# Patient Record
Sex: Female | Born: 1960 | Race: White | Hispanic: No | Marital: Married | Smoking: Never smoker
Health system: Southern US, Community
[De-identification: ages and names within clinical notes are randomized; demographics above are authoritative.]

## PROBLEM LIST (undated history)

## (undated) DIAGNOSIS — D509 Iron deficiency anemia, unspecified: Secondary | ICD-10-CM

## (undated) DIAGNOSIS — J309 Allergic rhinitis, unspecified: Secondary | ICD-10-CM

## (undated) DIAGNOSIS — I10 Essential (primary) hypertension: Secondary | ICD-10-CM

## (undated) DIAGNOSIS — M1990 Unspecified osteoarthritis, unspecified site: Secondary | ICD-10-CM

## (undated) DIAGNOSIS — E785 Hyperlipidemia, unspecified: Secondary | ICD-10-CM

## (undated) DIAGNOSIS — J45909 Unspecified asthma, uncomplicated: Secondary | ICD-10-CM

## (undated) HISTORY — PX: COLONOSCOPY: SHX174

## (undated) HISTORY — DX: Iron deficiency anemia, unspecified: D50.9

## (undated) HISTORY — DX: Unspecified osteoarthritis, unspecified site: M19.90

## (undated) HISTORY — DX: Essential (primary) hypertension: I10

## (undated) HISTORY — DX: Unspecified asthma, uncomplicated: J45.909

## (undated) HISTORY — PX: ESOPHAGOGASTRODUODENOSCOPY: SHX1529

## (undated) HISTORY — PX: KNEE SURGERY: SHX244

## (undated) HISTORY — DX: Hyperlipidemia, unspecified: E78.5

---

## 2006-04-11 ENCOUNTER — Ambulatory Visit: Payer: Self-pay | Admitting: Gynecology

## 2007-06-12 ENCOUNTER — Ambulatory Visit: Payer: Self-pay | Admitting: Gastroenterology

## 2007-06-26 ENCOUNTER — Ambulatory Visit: Payer: Self-pay | Admitting: Gastroenterology

## 2010-09-21 ENCOUNTER — Ambulatory Visit: Payer: Self-pay | Admitting: Gastroenterology

## 2010-09-22 LAB — PATHOLOGY REPORT

## 2012-06-02 LAB — PULMONARY FUNCTION TEST

## 2012-07-11 ENCOUNTER — Encounter: Payer: Self-pay | Admitting: Pulmonary Disease

## 2012-07-11 ENCOUNTER — Ambulatory Visit (INDEPENDENT_AMBULATORY_CARE_PROVIDER_SITE_OTHER): Payer: Self-pay | Admitting: Pulmonary Disease

## 2012-07-11 VITALS — BP 142/94 | HR 88 | Temp 98.1°F | Ht 61.0 in | Wt 126.0 lb

## 2012-07-11 DIAGNOSIS — J309 Allergic rhinitis, unspecified: Secondary | ICD-10-CM | POA: Insufficient documentation

## 2012-07-11 DIAGNOSIS — J45909 Unspecified asthma, uncomplicated: Secondary | ICD-10-CM

## 2012-07-11 DIAGNOSIS — J4520 Mild intermittent asthma, uncomplicated: Secondary | ICD-10-CM

## 2012-07-11 MED ORDER — BECLOMETHASONE DIPROPIONATE 80 MCG/ACT IN AERS: 1.0000 | INHALATION_SPRAY | Freq: Two times a day (BID) | RESPIRATORY_TRACT | Status: DC

## 2012-07-11 NOTE — Progress Notes (Signed)
Subjective:    Patient ID: Barbara Acosta, female    DOB: May 17, 1960, 52 y.o.   MRN: [id number]  HPI  This is a very pleasant 52 year old female with a past medical history of asthma who comes to our clinic today for evaluation and management of the same. She believes that she had asthma as a child because she had recurrent bronchitis however she never received a diagnosis. She never smoked cigarettes, however in her early 52s she developed the onset of recurrent bronchitis, chest heaviness, shortness of breath, and cough which would not go away. Apparently she had a chest x-ray which was worrisome for asthma and so she saw a pulmonologist here in town and underwent pulmonary function testing. She was given a diagnosis of asthma and was started on inhaled corticosteroids at that time. Since that point she has had yearly flares of asthma requiring treatment with a Z-Pak and occasionally steroids. Typically these happen in the wintertime and resolve quite quickly with therapy. However in 2014 she has had ongoing symptoms of shortness of breath and cough.  Starting December 2013 she had the sudden onset of wheezing, and coughing. This was associated with myalgias and fatigue. She had been on Advair 100/50 up until this point and had been using the medication regularly. However, her cough and shortness of breath persisted longer than normal. She was given prednisone and antibiotics for nearly the entire month of January and several weeks in February as well. She was treated with both azithromycin and Levaquin. Apparently her current cough lasted for quite some time and was very difficult to treat. She was also seen by her ear nose and throat physician who changed her Advair to [facility].  She did not feel that this change made much of a difference. On 05/09/2012 she stopped taking all of her medications with the exception of Allegra. She stated that slowly, over the next several weeks she had improvement in cough,  shortness of breath, and a scratchy throat. Currently, she does not have symptoms of cough or shortness of breath that she feels that she is still recovering somewhat from the illness. Specifically, she states that she had leg weakness which developed after several weeks of prednisone has been slow to recover. She currently is capable of working out from to an hour several days a week at the gym and she feels that things are starting to get better. She has not had to use albuterol any more often than twice a month in the last 2 months.     Past Medical History  Diagnosis Date  . Hypertension   . Asthma   . Osteoarthritis   . Iron deficiency anemia   . Hyperlipidemia      Family History  Problem Relation Age of Onset  . Cancer - Colon Mother   . Uterine cancer Mother   . Lung cancer Brother      History   Social History  . Marital Status: Married    Spouse Name: N/A    Number of Children: N/A  . Years of Education: N/A   Occupational History  . Not on file.   Social History Main Topics  . Smoking status: Never Smoker   . Smokeless tobacco: Never Used  . Alcohol Use: No  . Drug Use: No  . Sexually Active: Not on file   Other Topics Concern  . Not on file   Social History Narrative  . No narrative on file     No Known Allergies  No outpatient prescriptions prior to visit.   No facility-administered medications prior to visit.      Review of Systems  Constitutional: Positive for diaphoresis and activity change. Negative for fever, chills, appetite change, fatigue and unexpected weight change.  HENT: Positive for nosebleeds, rhinorrhea and voice change. Negative for hearing loss, ear pain, congestion, sore throat, facial swelling, sneezing, mouth sores, trouble swallowing, neck pain, neck stiffness, dental problem, postnasal drip, sinus pressure, tinnitus and ear discharge.   Eyes: Positive for itching. Negative for photophobia, discharge and visual disturbance.   Respiratory: Positive for cough, chest tightness, shortness of breath and wheezing. Negative for apnea, choking and stridor.   Cardiovascular: Positive for palpitations. Negative for chest pain and leg swelling.  Gastrointestinal: Negative for nausea, vomiting, abdominal pain, constipation, blood in stool and abdominal distention.  Genitourinary: Negative for dysuria, urgency, frequency, hematuria, flank pain, decreased urine volume and difficulty urinating.  Musculoskeletal: Positive for myalgias and arthralgias. Negative for back pain, joint swelling and gait problem.  Skin: Negative for color change, pallor and rash.  Neurological: Negative for dizziness, tremors, seizures, syncope, speech difficulty, weakness, light-headedness, numbness and headaches.  Hematological: Negative for adenopathy. Does not bruise/bleed easily.  Psychiatric/Behavioral: Negative for confusion, sleep disturbance and agitation. The patient is not nervous/anxious.        Objective:   Physical Exam  Filed Vitals:   07/11/12 1425  BP: 142/94  Pulse: 88  Temp: 98.1 F (36.7 C)  TempSrc: Oral  Height: 5\' 1"  (1.549 m)  Weight: 126 lb (57.153 kg)  SpO2: 98%   Gen: well appearing, no acute distress HEENT: NCAT, PERRL, EOMi, OP clear, neck supple without masses PULM: CTA B CV: RRR, no mgr, no JVD AB: BS+, soft, nontender, no hsm Ext: warm, no edema, no clubbing, no cyanosis Derm: no rash or skin breakdown Neuro: A&Ox4, CN II-XII intact, strength 5/5 in all 4 extremities  April 2014 full pulmonary function test>> ratio 79%, FEV1 3.14 L (134% predicted) total lung capacity 6.24 L (143% predicted), residual volume 2.25 L (148% predicted), DLCO 21.4 (116% predicted) 07/11/2012 simple spirometry>> ratio 81%, FEV1 2.97 L, (124% predicted)     Assessment & Plan:   Intrinsic asthma [name] appears to have asthma which is exacerbated by viral respiratory infections in the wintertime. She may also have some  allergies in the winter which exacerbate her symptoms though her recent allergy testing from [place] [facility] are not consistent with wintertime allergies. 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 However given the fact that she is doing well now I do not think allergies are currently at play.  I think that the nasty spell she has had in 2014 is likely the result of a bad viral upper respiratory infection which is now resolved. I am encouraged by the fact that today's simple spirometry is unchanged when compared to her April full pulmonary function testing when she was on an inhaled corticosteroid and long-acting bronchodilator.  Notably, I question her compliance with medicines. She freely admits that she has self discontinued therapy recently.  Plan: -She should resume an inhaled corticosteroid at some point in the next few months, I do not think she needs to be on a combination agent -Continue albuterol as needed -Continue treatment of allergic rhinitis as detailed below -Continue followup with [place] [facility] -Followup with me in August 2014 -we  will need to carefully review compliance on each visit  Allergic rhinitis Continue Allegra for now. Use Flonase at one quarter the usual dose (1 spray every other day at first), if no nosebleeds then increase to 1 spray every day.    Updated Medication List Outpatient Encounter Prescriptions as of 07/11/2012  Medication Sig Dispense Refill  . amLODipine (NORVASC) 5 MG tablet Take 1 tablet by mouth daily.      . beclomethasone (QVAR) 80 MCG/ACT inhaler Inhale 1 puff into the lungs 2 (two) times daily.  1 Inhaler  2  . etodolac (LODINE) 400 MG tablet Take 1 tablet by mouth 2 (two) times daily.      . VENTOLIN HFA 108 (90 BASE) MCG/ACT inhaler Inhale 2 puffs into the lungs every 6 (six) hours as needed.       No  facility-administered encounter medications on file as of 07/11/2012.

## 2012-07-11 NOTE — Assessment & Plan Note (Addendum)
Barbara Acosta appears to have asthma which is exacerbated by viral respiratory infections in the wintertime. She may also have some allergies in the winter which exacerbate her symptoms though her recent allergy testing from [place] [facility] are not consistent with wintertime allergies. 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 However given the fact that she is doing well now I do not think allergies are currently at play.  I think that the nasty spell she has had in 2014 is likely the result of a bad viral upper respiratory infection which is now resolved. I am encouraged by the fact that today's simple spirometry is unchanged when compared to her April full pulmonary function testing when she was on an inhaled corticosteroid and long-acting bronchodilator.  Notably, I question her compliance with medicines. She freely admits that she has self discontinued therapy recently.  Plan: -She should resume an inhaled corticosteroid at some point in the next few months, I do not think she needs to be on a combination agent -Continue albuterol as needed -Continue treatment of allergic rhinitis as detailed below -Continue followup with [place] [facility] -Followup with me in August 2014 -we will need to carefully review compliance on each visit

## 2012-07-11 NOTE — Assessment & Plan Note (Addendum)
Continue Allegra for now. Use Flonase at one quarter the usual dose (1 spray every other day at first), if no nosebleeds then increase to 1 spray every day.

## 2012-07-11 NOTE — Patient Instructions (Addendum)
Start taking your flonase one puff each nostril every other day  Keep taking your other medicines as you are doing, though you may try substituting the Allegra with Zyrtec (generic OK)  We will see you back by the middle of August 2014

## 2012-07-24 ENCOUNTER — Encounter: Payer: Self-pay | Admitting: Internal Medicine

## 2012-08-08 ENCOUNTER — Encounter: Payer: Self-pay | Admitting: Specialist

## 2012-09-19 ENCOUNTER — Ambulatory Visit (INDEPENDENT_AMBULATORY_CARE_PROVIDER_SITE_OTHER): Payer: 59 | Admitting: Pulmonary Disease

## 2012-09-19 ENCOUNTER — Encounter: Payer: Self-pay | Admitting: Pulmonary Disease

## 2012-09-19 VITALS — BP 118/76 | HR 80 | Temp 98.2°F | Ht 61.0 in | Wt 124.0 lb

## 2012-09-19 DIAGNOSIS — J45909 Unspecified asthma, uncomplicated: Secondary | ICD-10-CM

## 2012-09-19 DIAGNOSIS — J309 Allergic rhinitis, unspecified: Secondary | ICD-10-CM

## 2012-09-19 DIAGNOSIS — J4520 Mild intermittent asthma, uncomplicated: Secondary | ICD-10-CM

## 2012-09-19 NOTE — Progress Notes (Signed)
Subjective:    Patient ID: Barbara Acosta, female    DOB: 10-27-1960, 52 y.o.   MRN: [id number]   Synopsis: Barbara Acosta first saw the [facility] in June 2014 for mild intermittent asthma. 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 Full pulmonary function testing in April of 2014 as well as simple spirometry in June of 2014 were normal. Most of her symptoms are do to allergic rhinitis and postnasal drip. HPI  09/19/2012 ROV >> Barbara Acosta has been doing well since the last visit. She basically did not take any medicines since I last saw her with the exception of cetirizine. She started taking Qvar again last week because school is about to start up again and she will be exposed to a lot of children. She's not had cough, shortness of breath, or wheezing. She's only had to use her albuterol inhaler once or twice in the last several weeks with exercise.  Past Medical History  Diagnosis Date  . Hypertension   . Asthma   . Osteoarthritis   . Iron deficiency anemia   . Hyperlipidemia      Review of Systems  Constitutional: Negative for fever, chills and fatigue.  HENT: Negative for congestion, rhinorrhea and postnasal drip.   Respiratory: Negative for choking, shortness of breath and wheezing.   Cardiovascular: Negative for chest pain, palpitations and leg swelling.       Objective:   Physical Exam  Filed Vitals:   09/19/12 1200  BP: 118/76  Pulse: 80  Temp: 98.2 F (36.8 C)  TempSrc: Oral  Height: 5\' 1"  (1.549 m)  Weight: 124 lb (56.246 kg)  SpO2: 99%   Gen: well appearing, no acute distress HEENT: NCAT, EOMi, OP clear PULM: CTA B CV: RRR, no mgr, no JVD AB: BS+, soft, nontender, no hsm Ext: warm, no edema, no clubbing, no cyanosis       Assessment & Plan:   Intrinsic asthma This has been a stable interval for Barbara Acosta.  She has done well off of  drug since the last visit, but appropriately started taking QVar last week since school is back in session and her risk for asthma flares is up.  Plan: -continue QVar until Jan 2015 when she sees me next  Allergic rhinitis Continue Zyrtec    Updated Medication List Outpatient Encounter Prescriptions as of 09/19/2012  Medication Sig Dispense Refill  . amLODipine (NORVASC) 5 MG tablet Take 1 tablet by mouth daily.      . Ascorbic Acid (VITAMIN C) 1000 MG tablet Take 1,000 mg by mouth daily.      Marland Kitchen b complex vitamins tablet Take 1 tablet by mouth daily.      . beclomethasone (QVAR) 80 MCG/ACT inhaler Inhale 1 puff into the lungs 2 (two) times daily.  1 Inhaler  2  . cetirizine (ZYRTEC) 10 MG tablet Take 10 mg by mouth daily.      Marland Kitchen etodolac (LODINE) 400 MG tablet Take 1 tablet by mouth 2 (two) times daily as needed.       . fish oil-omega-3 fatty acids 1000 MG capsule Take 1 g by mouth daily.      . fluticasone (FLONASE) 50 MCG/ACT nasal spray Place 2 sprays into the nose daily.      . Multiple Vitamin (MULTIVITAMIN) tablet Take 1 tablet by mouth daily.      . Probiotic Product (PROBIOTIC DAILY PO) Take 1  capsule by mouth daily.      . VENTOLIN HFA 108 (90 BASE) MCG/ACT inhaler Inhale 2 puffs into the lungs every 6 (six) hours as needed.       No facility-administered encounter medications on file as of 09/19/2012.

## 2012-09-19 NOTE — Patient Instructions (Signed)
Keep taking your QVar twice a day until you see me next We will see you back in January of 2015

## 2012-09-19 NOTE — Assessment & Plan Note (Signed)
This has been a stable interval for Barbara Acosta.  She has done well off of drug since the last visit, but appropriately started taking QVar last week since school is back in session and her risk for asthma flares is up.  Plan: -continue QVar until Jan 2015 when she sees me next

## 2012-09-19 NOTE — Assessment & Plan Note (Signed)
Continue Zyrtec.

## 2012-10-20 ENCOUNTER — Other Ambulatory Visit: Payer: Self-pay | Admitting: *Deleted

## 2012-10-20 MED ORDER — BECLOMETHASONE DIPROPIONATE 80 MCG/ACT IN AERS: 1.0000 | INHALATION_SPRAY | Freq: Two times a day (BID) | RESPIRATORY_TRACT | Status: DC

## 2012-12-15 ENCOUNTER — Ambulatory Visit: Payer: Self-pay

## 2013-02-19 ENCOUNTER — Other Ambulatory Visit: Payer: Self-pay | Admitting: Pulmonary Disease

## 2013-02-19 MED ORDER — BECLOMETHASONE DIPROPIONATE 80 MCG/ACT IN AERS: 1.0000 | INHALATION_SPRAY | Freq: Two times a day (BID) | RESPIRATORY_TRACT | Status: DC

## 2013-03-05 ENCOUNTER — Ambulatory Visit: Payer: 59 | Admitting: Pulmonary Disease

## 2013-03-13 ENCOUNTER — Encounter (INDEPENDENT_AMBULATORY_CARE_PROVIDER_SITE_OTHER): Payer: Self-pay

## 2013-03-13 ENCOUNTER — Ambulatory Visit (INDEPENDENT_AMBULATORY_CARE_PROVIDER_SITE_OTHER): Payer: 59 | Admitting: Pulmonary Disease

## 2013-03-13 ENCOUNTER — Encounter: Payer: Self-pay | Admitting: Pulmonary Disease

## 2013-03-13 VITALS — BP 126/72 | HR 84 | Temp 98.2°F | Ht 61.0 in | Wt 129.0 lb

## 2013-03-13 DIAGNOSIS — J45909 Unspecified asthma, uncomplicated: Secondary | ICD-10-CM

## 2013-03-13 DIAGNOSIS — J309 Allergic rhinitis, unspecified: Secondary | ICD-10-CM

## 2013-03-13 MED ORDER — BECLOMETHASONE DIPROPIONATE 80 MCG/ACT IN AERS: 1.0000 | INHALATION_SPRAY | Freq: Two times a day (BID) | RESPIRATORY_TRACT | Status: DC

## 2013-03-13 MED ORDER — ALBUTEROL SULFATE HFA 108 (90 BASE) MCG/ACT IN AERS: 2.0000 | INHALATION_SPRAY | Freq: Four times a day (QID) | RESPIRATORY_TRACT | Status: DC | PRN

## 2013-03-13 MED ORDER — FLUTICASONE PROPIONATE 50 MCG/ACT NA SUSP: 2.0000 | Freq: Every day | NASAL | Status: DC

## 2013-03-13 NOTE — Patient Instructions (Signed)
Keep taking your medicines as you are doing We will see you back in 6 months

## 2013-03-13 NOTE — Assessment & Plan Note (Signed)
This is been a very stable interval for [place].  Plan: -Continue Qvar -Continue allergic rhinitis treatment -Continue as needed albuterol

## 2013-03-13 NOTE — Progress Notes (Signed)
Subjective:    Patient ID: Barbara Acosta, female    DOB: 05/06/1960, 53 y.o.   MRN: [id number]   Synopsis: Barbara Acosta first saw the [facility] pulmonary clinic in June 2014 for mild intermittent asthma. 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 Full pulmonary function testing in April of 2014 as well as simple spirometry in June of 2014 were normal. Most of her symptoms are do to allergic rhinitis and postnasal drip. HPI   09/19/2012 ROV >> Barbara Acosta has been doing well since the last visit. She basically did not take any medicines since I last saw her with the exception of cetirizine. She started taking Qvar again last week because school is about to start up again and she will be exposed to a lot of children. She's not had cough, shortness of breath, or wheezing. She's only had to use her albuterol inhaler once or twice in the last several weeks with exercise.  03/13/2013 ROV >> Barbara Acosta says that she has been doing good since the last visit with the exception of a cough and wheezing that occurred around Christmas.  She remains active and is exercising regularly, but doesn't have shortness of breath.  She has stayed on the QVar since the last visit and hasn't stopped taking it since the last visit.   Past Medical History  Diagnosis Date  . Hypertension   . Asthma   . Osteoarthritis   . Iron deficiency anemia   . Hyperlipidemia      Review of Systems  Constitutional: Negative for fever, chills and fatigue.  HENT: Negative for congestion, postnasal drip and rhinorrhea.   Respiratory: Negative for choking, shortness of breath and wheezing.   Cardiovascular: Negative for chest pain, palpitations and leg swelling.       Objective:   Physical Exam   Filed Vitals:   03/13/13 1553  BP: 126/72  Pulse: 84  Temp: 98.2 F (36.8 C)  TempSrc: Oral  Height: 5\' 1"  (1.549 m)   Weight: 129 lb (58.514 kg)  SpO2: 99%   Gen: well appearing, no acute distress HEENT: NCAT, EOMi, OP clear PULM: CTA B CV: RRR, no mgr, no JVD AB: BS+, soft, nontender, no hsm Ext: warm, no edema, no clubbing, no cyanosis      Assessment & Plan:   Intrinsic asthma This is been a very stable interval for [place].  Plan: -Continue Qvar -Continue allergic rhinitis treatment -Continue as needed albuterol  Allergic rhinitis Continue Flonase    Updated Medication List Outpatient Encounter Prescriptions as of 03/13/2013  Medication Sig  . amLODipine (NORVASC) 5 MG tablet Take 1 tablet by mouth daily.  . Ascorbic Acid (VITAMIN C) 1000 MG tablet Take 1,000 mg by mouth daily.  Marland Kitchen b complex vitamins tablet Take 1 tablet by mouth daily.  . beclomethasone (QVAR) 80 MCG/ACT inhaler Inhale 1 puff into the lungs 2 (two) times daily.  . cetirizine (ZYRTEC) 10 MG tablet Take 10 mg by mouth daily.  Marland Kitchen etodolac (LODINE) 400 MG tablet Take 1 tablet by mouth 2 (two) times daily as needed.   . fish oil-omega-3 fatty acids 1000 MG capsule Take 1 g by mouth daily.  . fluticasone (FLONASE) 50 MCG/ACT nasal spray Place 2 sprays into the nose daily.  . Multiple Vitamin (MULTIVITAMIN) tablet Take 1 tablet by mouth daily.  . Probiotic Product (PROBIOTIC DAILY PO) Take 1 capsule by mouth daily.  . VENTOLIN  HFA 108 (90 BASE) MCG/ACT inhaler Inhale 2 puffs into the lungs every 6 (six) hours as needed.

## 2013-03-13 NOTE — Assessment & Plan Note (Signed)
-   Continue Flonase  °

## 2013-05-18 ENCOUNTER — Other Ambulatory Visit: Payer: Self-pay

## 2013-05-18 MED ORDER — BECLOMETHASONE DIPROPIONATE 80 MCG/ACT IN AERS: 1.0000 | INHALATION_SPRAY | Freq: Two times a day (BID) | RESPIRATORY_TRACT | Status: DC

## 2013-06-07 ENCOUNTER — Ambulatory Visit: Payer: Self-pay | Admitting: Orthopedic Surgery

## 2013-12-27 ENCOUNTER — Other Ambulatory Visit: Payer: Self-pay

## 2013-12-27 MED ORDER — BECLOMETHASONE DIPROPIONATE 80 MCG/ACT IN AERS: 1.0000 | INHALATION_SPRAY | Freq: Two times a day (BID) | RESPIRATORY_TRACT | Status: DC

## 2014-01-28 ENCOUNTER — Ambulatory Visit: Payer: 59 | Admitting: Pulmonary Disease

## 2014-02-13 ENCOUNTER — Encounter: Payer: Self-pay | Admitting: Pulmonary Disease

## 2014-02-13 ENCOUNTER — Ambulatory Visit (INDEPENDENT_AMBULATORY_CARE_PROVIDER_SITE_OTHER): Payer: 59 | Admitting: Pulmonary Disease

## 2014-02-13 VITALS — BP 124/76 | HR 84 | Ht 62.0 in | Wt 128.0 lb

## 2014-02-13 DIAGNOSIS — J45909 Unspecified asthma, uncomplicated: Secondary | ICD-10-CM

## 2014-02-13 DIAGNOSIS — J3089 Other allergic rhinitis: Secondary | ICD-10-CM

## 2014-02-13 NOTE — Assessment & Plan Note (Signed)
She continues to struggle with allergic rhinitis which is causing her symptoms today (cough, mucus production) as her lungs are clear.  Plan: Change Flonase to Dymista temporarily (samples given) Use chlorpheniramine-phenylephrine tablets instead of Zyrtec Encouraged Barbara Acosta med saline rinses

## 2014-02-13 NOTE — Progress Notes (Signed)
Subjective:    Patient ID: Barbara Acosta, female    DOB: Jun 26, 1960, 54 y.o.   MRN: [id number]   Synopsis: Barbara Acosta first saw the [facility] pulmonary clinic in June 2014 for mild intermittent asthma. 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 Full pulmonary function testing in April of 2014 as well as simple spirometry in June of 2014 were normal. Most of her symptoms are do to allergic rhinitis and postnasal drip. HPI  Chief Complaint  Patient presents with  . Follow-up    Pt saw Dr. Caryl Acosta in Nov and was given abx/steroid pack. She has hoarseness today, she has drainage which is causing alot of throat clearing. She has lots of mucus 1st thing am. She is using zytec and flonase.   Barbara Acosta says that she was treated with prednisone the week before Thanksgiving for an exacerbation of allergic rhinitis and possibly asthma. Since then her symptoms have improved but she continues to have significant mucus production with cough and she feels increased sinus congestion. She has also experienced some chest tightness but she denies wheezing or shortness of breath. She remains active working daily. She continues to take Qvar 1 puff twice a day as well as Flonase regularly.  Past Medical History  Diagnosis Date  . Hypertension   . Asthma   . Osteoarthritis   . Iron deficiency anemia   . Hyperlipidemia      Review of Systems  Constitutional: Negative for fever, chills and fatigue.  HENT: Positive for postnasal drip and rhinorrhea. Negative for congestion.   Respiratory: Positive for cough. Negative for choking, shortness of breath and wheezing.   Cardiovascular: Negative for chest pain, palpitations and leg swelling.       Objective:   Physical Exam  Filed Vitals:   02/13/14 1227  BP: 124/76  Pulse: 84  Height: 5\' 2"  (1.575 m)  Weight: 128 lb (58.06 kg)  SpO2: 94%    Gen: well appearing, no acute distress HEENT: NCAT, EOMi, OP clear PULM: CTA B CV: RRR, no mgr, no JVD AB: BS+, soft, nontender, no hsm Ext: warm, no edema, no clubbing, no cyanosis      Assessment & Plan:   Intrinsic asthma Though she may have had a recent exacerbation, today her lungs are clear so I don't think her asthma is acting up as much as her sinuses.  Plan: -continue QVar at current dosing  Allergic rhinitis She continues to struggle with allergic rhinitis which is causing her symptoms today (cough, mucus production) as her lungs are clear.  Plan: Change Flonase to Dymista temporarily (samples given) Use chlorpheniramine-phenylephrine tablets instead of Zyrtec Encouraged Milta Deiters med saline rinses    Updated Medication List Outpatient Encounter Prescriptions as of 02/13/2014  Medication Sig  . albuterol (VENTOLIN HFA) 108 (90 BASE) MCG/ACT inhaler Inhale 2 puffs into the lungs every 6 (six) hours as needed for wheezing or shortness of breath.  . ALPRAZolam (XANAX) 1 MG tablet Take 1 mg by mouth as needed.  Marland Kitchen amLODipine (NORVASC) 5 MG tablet Take 1 tablet by mouth daily.  . Ascorbic Acid (VITAMIN C) 1000 MG tablet Take 1,000 mg by mouth daily.  Marland Kitchen b complex vitamins tablet Take 1 tablet by mouth daily.  . beclomethasone (QVAR) 80 MCG/ACT inhaler Inhale 1 puff into the lungs 2 (two) times daily.  . cetirizine (ZYRTEC) 10 MG tablet Take 10 mg by mouth daily.  Marland Kitchen  fish oil-omega-3 fatty acids 1000 MG capsule Take 1 g by mouth daily.  . fluticasone (FLONASE) 50 MCG/ACT nasal spray Place 2 sprays into both nostrils daily.  . meloxicam (MOBIC) 15 MG tablet Take 1 tablet by mouth daily.  . Multiple Vitamin (MULTIVITAMIN) tablet Take 1 tablet by mouth daily.  . Probiotic Product (PROBIOTIC DAILY PO) Take 1 capsule by mouth daily.  . valACYclovir (VALTREX) 1000 MG tablet Take 1 g by mouth as needed.  . [DISCONTINUED] etodolac (LODINE) 400 MG tablet Take 1 tablet by mouth 2 (two)  times daily as needed.

## 2014-02-13 NOTE — Patient Instructions (Signed)
Take Dymista 2 puffs each nostril twice a day instead of the flonase Use Neil Med rinses with distilled water at least twice per day using the instructions on the package. 1/2 hour after using the [place] rinse, use Dymista. Instead of cetirizine, use chlorpheniramine-phenylephrine combination tablets. We will see you back in 6 months or sooner if needed

## 2014-02-13 NOTE — Assessment & Plan Note (Signed)
Though she may have had a recent exacerbation, today her lungs are clear so I don't think her asthma is acting up as much as her sinuses.  Plan: -continue QVar at current dosing

## 2014-02-25 ENCOUNTER — Ambulatory Visit: Payer: 59 | Admitting: Pulmonary Disease

## 2014-03-21 ENCOUNTER — Telehealth: Payer: Self-pay | Admitting: Pulmonary Disease

## 2014-03-21 NOTE — Telephone Encounter (Signed)
Called spoke with pt. She reports the Dymista is tier 4. She wants to know if there is something else that can be called in that is the same. Can the 2 components that make up dymista be called in? thanks

## 2014-03-23 NOTE — Telephone Encounter (Signed)
Yes, OK to call in Astelin (standard dose) and have her buy nasacort OTC

## 2014-03-25 MED ORDER — AZELASTINE HCL 0.1 % NA SOLN: 2.0000 | Freq: Two times a day (BID) | NASAL | Status: AC

## 2014-03-25 NOTE — Telephone Encounter (Signed)
Spoke with pt.  Discussed below.  She verbalized understanding and is aware astelin rx sent to [facility] and will pick nasacort up OTC.  Nothing further needed.

## 2014-03-29 ENCOUNTER — Other Ambulatory Visit: Payer: Self-pay

## 2014-03-29 MED ORDER — BECLOMETHASONE DIPROPIONATE 80 MCG/ACT IN AERS
1.0000 | INHALATION_SPRAY | Freq: Two times a day (BID) | RESPIRATORY_TRACT | Status: DC
Start: 2014-03-29 — End: 2014-10-16

## 2014-04-03 ENCOUNTER — Telehealth: Payer: Self-pay | Admitting: Pulmonary Disease

## 2014-04-03 NOTE — Telephone Encounter (Signed)
From records review it appears she has more sinus/rhinitis issues than asthma issues.  Unless she is having purulence from chest or nose, would like to avoid abx. Would not give prednisone if she is not having increased sob.  Would recommend neil med sinus rinses bid until better, make sure she is taking a daily antihistamine, and can also try mucinex dm one am and pm until better.  Let us know if not improving.

## 2014-04-03 NOTE — Telephone Encounter (Signed)
Sending to doc of day as we have not yet heard from BQ.  [place] please advise.  Thanks!

## 2014-04-03 NOTE — Telephone Encounter (Signed)
Spoke with pt, c/o nonprod cough X3 days, some sinus congestion.  Denies any sob, fever.   is requesting an abx and pred taper.  Uses [facility] on [address]    BQ please advise.  Thanks!

## 2014-04-03 NOTE — Telephone Encounter (Signed)
Pt aware of recs.  Nothing further needed. 

## 2014-10-08 ENCOUNTER — Other Ambulatory Visit: Payer: Self-pay

## 2014-10-08 MED ORDER — ALBUTEROL SULFATE HFA 108 (90 BASE) MCG/ACT IN AERS: 2.0000 | INHALATION_SPRAY | Freq: Four times a day (QID) | RESPIRATORY_TRACT | Status: AC | PRN

## 2014-10-16 ENCOUNTER — Other Ambulatory Visit: Payer: Self-pay

## 2014-10-16 MED ORDER — BECLOMETHASONE DIPROPIONATE 80 MCG/ACT IN AERS: 1.0000 | INHALATION_SPRAY | Freq: Two times a day (BID) | RESPIRATORY_TRACT | Status: DC

## 2014-10-28 ENCOUNTER — Encounter: Payer: Self-pay | Admitting: Podiatry

## 2014-10-28 ENCOUNTER — Ambulatory Visit (INDEPENDENT_AMBULATORY_CARE_PROVIDER_SITE_OTHER): Payer: 59 | Admitting: Podiatry

## 2014-10-28 ENCOUNTER — Ambulatory Visit (INDEPENDENT_AMBULATORY_CARE_PROVIDER_SITE_OTHER): Payer: 59

## 2014-10-28 VITALS — BP 132/90 | HR 69 | Resp 12

## 2014-10-28 DIAGNOSIS — M722 Plantar fascial fibromatosis: Secondary | ICD-10-CM | POA: Diagnosis not present

## 2014-10-28 MED ORDER — METHYLPREDNISOLONE 4 MG PO TBPK: ORAL_TABLET | ORAL | Status: DC

## 2014-10-28 MED ORDER — MELOXICAM 15 MG PO TABS: 15.0000 mg | ORAL_TABLET | Freq: Every day | ORAL | Status: AC

## 2014-10-28 NOTE — Progress Notes (Signed)
   Subjective:    Patient ID: Barbara Acosta, female    DOB: 1960/09/28, 54 y.o.   MRN: [id number]  HPI: She presents today with a 3 month duration of pain to her right heel. She states that it hurts particularly with pressure when walking but also in the morning when she gets out of bed. It also bothers her after she's been sitting for a while and gets up to re-ambulate.    Review of Systems  HENT: Positive for sinus pressure.   Allergic/Immunologic: Positive for environmental allergies.       Objective:   Physical Exam: Pulses are strongly palpable bilateral. Neurologic sensorium is intact per Semmes-Weinstein monofilament. Deep tendon reflexes are intact bilateral muscle strength was 5 over 5 dorsiflexion plantar flexors and inverters everters MUSCULATURE is intact. Orthopedic evaluation was resulted joints distal to the ankle for range of motion and crepitus pain on palpation medial calcaneal tubercle right heel. Radiographs do demonstrate 3 views taken in the office today of the right foot soft tissue increase in difficulty for fashion calcaneal insertion site of the right heel. Cutaneous evaluation demonstrates supple well-hydrated cutis no erythema edema C Legendre odor.        Assessment & Plan:  Plantar fasciitis right.  Plan: We discussed the etiology pathology conservative versus surgical therapies. At this point I recommend an injection to the right heel with him on local and aesthetic.  Plantar fascial brace right and an ice splint right. I also started her on a Medrol Dosepak to be followed by meloxicam. I will follow-up with her in 1 month.

## 2014-10-28 NOTE — Patient Instructions (Signed)
Plantar Fasciitis  Plantar fasciitis is a common condition that causes foot pain. It is soreness (inflammation) of the band of tough fibrous tissue on the bottom of the foot that runs from the heel bone (calcaneus) to the ball of the foot. The cause of this soreness may be from excessive standing, poor fitting shoes, running on hard surfaces, being overweight, having an abnormal walk, or overuse (this is common in runners) of the painful foot or feet. It is also common in aerobic exercise dancers and ballet dancers.  SYMPTOMS   Most people with plantar fasciitis complain of:   Severe pain in the morning on the bottom of their foot especially when taking the first steps out of bed. This pain recedes after a few minutes of walking.   Severe pain is experienced also during walking following a long period of inactivity.   Pain is worse when walking barefoot or up stairs  DIAGNOSIS    Your caregiver will diagnose this condition by examining and feeling your foot.   Special tests such as X-rays of your foot, are usually not needed.  PREVENTION    Consult a sports medicine professional before beginning a new exercise program.   Walking programs offer a good workout. With walking there is a lower chance of overuse injuries common to runners. There is less impact and less jarring of the joints.   Begin all new exercise programs slowly. If problems or pain develop, decrease the amount of time or distance until you are at a comfortable level.   Wear good shoes and replace them regularly.   Stretch your foot and the heel cords at the back of the ankle (Achilles tendon) both before and after exercise.   Run or exercise on even surfaces that are not hard. For example, asphalt is better than pavement.   Do not run barefoot on hard surfaces.   If using a treadmill, vary the incline.   Do not continue to workout if you have foot or joint problems. Seek professional help if they do not improve.  HOME CARE INSTRUCTIONS     Avoid activities that cause you pain until you recover.   Use ice or cold packs on the problem or painful areas after working out.   Only take over-the-counter or prescription medicines for pain, discomfort, or fever as directed by your caregiver.   Soft shoe inserts or athletic shoes with air or gel sole cushions may be helpful.   If problems continue or become more severe, consult a sports medicine caregiver or your own health care Sherina Stammer. Cortisone is a potent anti-inflammatory medication that may be injected into the painful area. You can discuss this treatment with your caregiver.  MAKE SURE YOU:    Understand these instructions.   Will watch your condition.   Will get help right away if you are not doing well or get worse.  Document Released: 10/20/2000 Document Revised: 04/19/2011 Document Reviewed: 12/20/2007  ExitCare Patient Information 2015 [employer]. This information is not intended to replace advice given to you by your health care Seniya Stoffers. Make sure you discuss any questions you have with your health care Tahara Ruffini.

## 2014-11-25 ENCOUNTER — Ambulatory Visit (INDEPENDENT_AMBULATORY_CARE_PROVIDER_SITE_OTHER): Payer: 59 | Admitting: Podiatry

## 2014-11-25 ENCOUNTER — Encounter: Payer: Self-pay | Admitting: Podiatry

## 2014-11-25 VITALS — BP 122/85 | HR 78 | Resp 16

## 2014-11-25 DIAGNOSIS — M722 Plantar fascial fibromatosis: Secondary | ICD-10-CM

## 2014-11-26 NOTE — Progress Notes (Signed)
She presents today for follow-up of her plantar fasciitis right. She states is doing much better but is just not well yet.  Objective: Vital signs are stable she's alert and oriented 3. Pulses are strongly palpable. She has pain on palpation medial calcaneal tubercle of the right heel. No pain on medial and lateral compression of the calcaneus. All intrinsic musculature is intact. Neurologic sensorium is intact.  Assessment: Chronic intractable plantar fasciitis right.  Plan: Injected the right heel today with Kenalog and local anesthetic. She will continue her anti-inflammatories plantar fascial brace night splint and she will follow-up with me in 1 month.  Roselind Messier DPM

## 2014-12-23 ENCOUNTER — Ambulatory Visit: Payer: 59 | Admitting: Podiatry

## 2015-02-10 ENCOUNTER — Other Ambulatory Visit: Payer: Self-pay | Admitting: Pulmonary Disease

## 2015-03-06 ENCOUNTER — Other Ambulatory Visit: Payer: Self-pay | Admitting: Podiatry

## 2015-06-03 ENCOUNTER — Other Ambulatory Visit: Payer: Self-pay | Admitting: Pulmonary Disease

## 2015-06-03 MED ORDER — BECLOMETHASONE DIPROPIONATE 80 MCG/ACT IN AERS: INHALATION_SPRAY | RESPIRATORY_TRACT | Status: AC

## 2015-12-08 ENCOUNTER — Ambulatory Visit: Payer: 59 | Admitting: Anesthesiology

## 2015-12-08 ENCOUNTER — Encounter: Payer: Self-pay | Admitting: *Deleted

## 2015-12-08 ENCOUNTER — Ambulatory Visit
Admission: RE | Admit: 2015-12-08 | Discharge: 2015-12-08 | Disposition: A | Discharge status: Home / Self Care | Payer: 59 | Source: Ambulatory Visit | Attending: Gastroenterology | Admitting: Gastroenterology

## 2015-12-08 ENCOUNTER — Encounter
Admission: RE | Disposition: A | Discharge status: Home / Self Care | Payer: Self-pay | Source: Ambulatory Visit | Attending: Gastroenterology

## 2015-12-08 DIAGNOSIS — Z791 Long term (current) use of non-steroidal anti-inflammatories (NSAID): Secondary | ICD-10-CM | POA: Insufficient documentation

## 2015-12-08 DIAGNOSIS — I10 Essential (primary) hypertension: Secondary | ICD-10-CM | POA: Diagnosis not present

## 2015-12-08 DIAGNOSIS — Z79899 Other long term (current) drug therapy: Secondary | ICD-10-CM | POA: Diagnosis not present

## 2015-12-08 DIAGNOSIS — Z7951 Long term (current) use of inhaled steroids: Secondary | ICD-10-CM | POA: Insufficient documentation

## 2015-12-08 DIAGNOSIS — K621 Rectal polyp: Secondary | ICD-10-CM | POA: Insufficient documentation

## 2015-12-08 DIAGNOSIS — M1990 Unspecified osteoarthritis, unspecified site: Secondary | ICD-10-CM | POA: Diagnosis not present

## 2015-12-08 DIAGNOSIS — K6389 Other specified diseases of intestine: Secondary | ICD-10-CM | POA: Insufficient documentation

## 2015-12-08 DIAGNOSIS — D509 Iron deficiency anemia, unspecified: Secondary | ICD-10-CM | POA: Diagnosis not present

## 2015-12-08 DIAGNOSIS — Z1211 Encounter for screening for malignant neoplasm of colon: Secondary | ICD-10-CM | POA: Diagnosis not present

## 2015-12-08 DIAGNOSIS — K640 First degree hemorrhoids: Secondary | ICD-10-CM | POA: Diagnosis not present

## 2015-12-08 DIAGNOSIS — D124 Benign neoplasm of descending colon: Secondary | ICD-10-CM | POA: Insufficient documentation

## 2015-12-08 DIAGNOSIS — Z800 Family history of malignant neoplasm of digestive organs: Secondary | ICD-10-CM | POA: Insufficient documentation

## 2015-12-08 DIAGNOSIS — Z881 Allergy status to other antibiotic agents status: Secondary | ICD-10-CM | POA: Diagnosis not present

## 2015-12-08 DIAGNOSIS — K5730 Diverticulosis of large intestine without perforation or abscess without bleeding: Secondary | ICD-10-CM | POA: Insufficient documentation

## 2015-12-08 DIAGNOSIS — Z86010 Personal history of colonic polyps: Secondary | ICD-10-CM | POA: Diagnosis present

## 2015-12-08 DIAGNOSIS — J45909 Unspecified asthma, uncomplicated: Secondary | ICD-10-CM | POA: Diagnosis not present

## 2015-12-08 DIAGNOSIS — Z888 Allergy status to other drugs, medicaments and biological substances status: Secondary | ICD-10-CM | POA: Diagnosis not present

## 2015-12-08 DIAGNOSIS — E785 Hyperlipidemia, unspecified: Secondary | ICD-10-CM | POA: Diagnosis not present

## 2015-12-08 HISTORY — DX: Allergic rhinitis, unspecified: J30.9

## 2015-12-08 HISTORY — PX: COLONOSCOPY WITH PROPOFOL: SHX5780

## 2015-12-08 SURGERY — COLONOSCOPY WITH PROPOFOL
Anesthesia: General

## 2015-12-08 MED ORDER — SODIUM CHLORIDE 0.9 % IV SOLN
INTRAVENOUS | Status: DC
  Administered 2015-12-08: 09:00:00 via INTRAVENOUS

## 2015-12-08 MED ORDER — SODIUM CHLORIDE 0.9 % IV SOLN: INTRAVENOUS | Status: DC

## 2015-12-08 MED ORDER — PROPOFOL 10 MG/ML IV BOLUS
INTRAVENOUS | Status: DC | PRN
  Administered 2015-12-08: 50 mg via INTRAVENOUS

## 2015-12-08 MED ORDER — LIDOCAINE HCL (PF) 2 % IJ SOLN
INTRAMUSCULAR | Status: DC | PRN
  Administered 2015-12-08: 20 mg via INTRADERMAL

## 2015-12-08 MED ORDER — PROPOFOL 500 MG/50ML IV EMUL
INTRAVENOUS | Status: DC | PRN
  Administered 2015-12-08: 150 ug/kg/min via INTRAVENOUS

## 2015-12-08 NOTE — Anesthesia Postprocedure Evaluation (Signed)
Anesthesia Post Note  Patient: SHARIYAH NELLES  Procedure(s) Performed: Procedure(s) (LRB): COLONOSCOPY WITH PROPOFOL (N/A)  Patient location during evaluation: Endoscopy Anesthesia Type: General Level of consciousness: awake and alert and oriented Pain management: pain level controlled Vital Signs Assessment: post-procedure vital signs reviewed and stable Respiratory status: spontaneous breathing, nonlabored ventilation and respiratory function stable Cardiovascular status: blood pressure returned to baseline and stable Postop Assessment: no signs of nausea or vomiting Anesthetic complications: no    Last Vitals:  Vitals:   12/08/15 0948 12/08/15 0958  BP: (P) 110/73 115/80  Pulse:  73  Resp:  13  Temp:      Last Pain:  Vitals:   12/08/15 0938  TempSrc: Tympanic                 Isami Mehra

## 2015-12-08 NOTE — Addendum Note (Signed)
Addendum  created 12/08/15 1334 by Naomie Dean, CRNA   Charge Capture section accepted

## 2015-12-08 NOTE — H&P (Signed)
Outpatient short stay form Pre-procedure 12/08/2015 8:58 AM Barbara Sails MD  Primary Physician: Dr. Ramonita Lab  Reason for visit:  Colonoscopy  History of present illness:  Patient is a 55 year old female presenting today as above. She has personal history of adenomatous colon polyps as well as family history of colon cancer in primary relative. She tolerated her prep well. She takes no aspirin or blood thinning agents.    Current Facility-Administered Medications:  .  0.9 %  sodium chloride infusion, , Intravenous, Continuous, Barbara Sails, MD .  0.9 %  sodium chloride infusion, , Intravenous, Continuous, Barbara Sails, MD  Prescriptions Prior to Admission  Medication Sig Dispense Refill Last Dose  . albuterol (VENTOLIN HFA) 108 (90 BASE) MCG/ACT inhaler Inhale 2 puffs into the lungs every 6 (six) hours as needed for wheezing or shortness of breath. 1 Inhaler 4 12/08/2015 at 0600  . ALPRAZolam (XANAX) 1 MG tablet Take 1 mg by mouth as needed.  0 12/07/2015 at 2100  . amLODipine (NORVASC) 5 MG tablet Take 1 tablet by mouth daily.   12/08/2015 at 0600  . Ascorbic Acid (VITAMIN C) 1000 MG tablet Take 1,000 mg by mouth daily.   Past Week at Unknown time  . beclomethasone (QVAR) 80 MCG/ACT inhaler inhale 1 puff by mouth twice a day 8.7 Inhaler 0 12/08/2015 at 0600  . cetirizine (ZYRTEC) 10 MG tablet Take 10 mg by mouth daily.   12/08/2015 at 0600  . diclofenac sodium (VOLTAREN) 1 % GEL Apply 2 g topically 3 (three) times daily.     . fish oil-omega-3 fatty acids 1000 MG capsule Take 1 g by mouth daily.   Past Week at Unknown time  . fluticasone (FLONASE) 50 MCG/ACT nasal spray Place into both nostrils daily.   Past Week at Unknown time  . meloxicam (MOBIC) 15 MG tablet Take 1 tablet (15 mg total) by mouth daily. 30 tablet 3 Past Week at Unknown time  . Multiple Vitamin (MULTIVITAMIN) tablet Take 1 tablet by mouth daily.   Past Week at Unknown time  . azelastine (ASTELIN) 0.1 %  nasal spray Place 2 sprays into both nostrils 2 (two) times daily. Use in each nostril as directed (Patient not taking: Reported on 12/05/2015) 90 mL 3 Not Taking at Unknown time  . b complex vitamins tablet Take 1 tablet by mouth daily.   Not Taking at Unknown time  . meloxicam (MOBIC) 15 MG tablet Take 1 tablet by mouth daily.  0 Completed Course at Unknown time  . Probiotic Product (PROBIOTIC DAILY PO) Take 1 capsule by mouth daily.   Not Taking at Unknown time     Allergies  Allergen Reactions  . Levofloxacin   . Tessalon [Benzonatate] Other (See Comments)    Intense Headache     Past Medical History:  Diagnosis Date  . Allergic rhinitis   . Asthma   . Hyperlipidemia   . Hypertension   . Iron deficiency anemia   . Osteoarthritis     Review of systems:      Physical Exam    Heart and lungs: Regular rate and rhythm without rub or gallop, lungs are bilaterally clear.    HEENT: Normocephalic atraumatic eyes are anicteric    Other:     Pertinant exam for procedure: Soft nontender nondistended bowel sounds positive normoactive.    Planned proceedures: Colonoscopy and indicated procedures. I have discussed the risks benefits and complications of procedures to include not limited to bleeding, infection,  perforation and the risk of sedation and the patient wishes to proceed.    Barbara Sails, MD Gastroenterology 12/08/2015  8:58 AM

## 2015-12-08 NOTE — Op Note (Signed)
[facility] Gastroenterology Patient Name: Barbara Acosta Procedure Date: 12/08/2015 9:03 AM MRN: [id number] Account #: [id number] Date of Birth: 19-Sep-1960 Admit Type: Outpatient Age: 55 Room: [facility] ENDO ROOM 1 Gender: Female Note Status: Finalized Procedure:            Colonoscopy Indications:          Family history of colon cancer in a first-degree                        relative, Personal history of colonic polyps Providers:            Lollie Sails, MD Referring MD:         Ramonita Lab, MD (Referring MD) Medicines:            Monitored Anesthesia Care Complications:        No immediate complications. Procedure:            Pre-Anesthesia Assessment:                       - ASA Grade Assessment: II - A patient with mild                        systemic disease.                       After obtaining informed consent, the colonoscope was                        passed under direct vision. Throughout the procedure,                        the patient's blood pressure, pulse, and oxygen                        saturations were monitored continuously. The                        Colonoscope was introduced through the anus and                        advanced to the the cecum, identified by appendiceal                        orifice and ileocecal valve. The colonoscopy was                        performed without difficulty. The patient tolerated the                        procedure well. The quality of the bowel preparation                        was good. Findings:      A 1 mm polyp was found in the rectum. The polyp was sessile. The polyp       was removed with a cold biopsy forceps. Resection and retrieval were       complete.      A 1 mm polyp was found in the proximal descending colon. The polyp was       sessile. The polyp was removed with a cold biopsy forceps.  Resection and       retrieval were complete.      A diffuse area of mildly melanotic mucosa was  found in the entire colon.       Biopsies were taken with a cold forceps for histology.      Non-bleeding internal hemorrhoids were found during retroflexion and       during anoscopy. The hemorrhoids were small and Grade I (internal       hemorrhoids that do not prolapse).      The digital rectal exam was normal.      Multiple medium-mouthed diverticula were found in the sigmoid colon and       descending colon. Impression:           - One 1 mm polyp in the rectum, removed with a cold                        biopsy forceps. Resected and retrieved.                       - One 1 mm polyp in the proximal descending colon,                        removed with a cold biopsy forceps. Resected and                        retrieved.                       - Melanotic mucosa in the entire examined colon.                        Biopsied.                       - Non-bleeding internal hemorrhoids. Recommendation:       - Discharge patient to home.                       - Await pathology results.                       - Telephone GI clinic for pathology results in 1 week. Procedure Code(s):    --- Professional ---                       [id number], Colonoscopy, flexible; with biopsy, single or                        multiple Diagnosis Code(s):    --- Professional ---                       K62.1, Rectal polyp                       D12.4, Benign neoplasm of descending colon                       K63.89, Other specified diseases of intestine                       K64.0, First degree hemorrhoids  Z80.0, Family history of malignant neoplasm of                        digestive organs                       Z86.010, Personal history of colonic polyps CPT copyright 2016 [employer]. All rights reserved. The codes documented in this report are preliminary and upon coder review may  be revised to meet current compliance requirements. Lollie Sails, MD 12/08/2015 9:37:37  AM This report has been signed electronically. Number of Addenda: 0 Note Initiated On: 12/08/2015 9:03 AM Scope Withdrawal Time: 0 hours 8 minutes 6 seconds  Total Procedure Duration: 0 hours 18 minutes 10 seconds       [facility]

## 2015-12-08 NOTE — Transfer of Care (Signed)
Immediate Anesthesia Transfer of Care Note  Patient: Barbara Acosta  Procedure(s) Performed: Procedure(s): COLONOSCOPY WITH PROPOFOL (N/A)  Patient Location: PACU and Endoscopy Unit  Anesthesia Type:General  Level of Consciousness: awake  Airway & Oxygen Therapy: Patient Spontanous Breathing  Post-op Assessment: Post -op Vital signs reviewed and stable  Post vital signs: stable  Last Vitals:  Vitals:   12/08/15 0838  BP: (!) 150/84  Pulse: 94  Resp: 14  Temp: 36.2 C    Last Pain:  Vitals:   12/08/15 0838  TempSrc: Tympanic         Complications: No apparent anesthesia complications

## 2015-12-08 NOTE — Anesthesia Preprocedure Evaluation (Signed)
Anesthesia Evaluation  Patient identified by MRN, date of birth, ID band Patient awake    Reviewed: Allergy & Precautions, NPO status , Patient's Chart, lab work & pertinent test results  History of Anesthesia Complications Negative for: history of anesthetic complications  Airway Mallampati: II  TM Distance: >3 FB Neck ROM: Full    Dental no notable dental hx.    Pulmonary asthma , neg sleep apnea,    breath sounds clear to auscultation- rhonchi (-) wheezing      Cardiovascular hypertension, Pt. on medications (-) CAD and (-) Past MI  Rhythm:Regular Rate:Normal - Systolic murmurs and - Diastolic murmurs    Neuro/Psych negative neurological ROS  negative psych ROS   GI/Hepatic negative GI ROS, Neg liver ROS,   Endo/Other  negative endocrine ROSneg diabetes  Renal/GU negative Renal ROS     Musculoskeletal  (+) Arthritis ,   Abdominal (+) - obese,   Peds  Hematology  (+) anemia ,   Anesthesia Other Findings Past Medical History: No date: Allergic rhinitis No date: Asthma No date: Hyperlipidemia No date: Hypertension No date: Iron deficiency anemia No date: Osteoarthritis   Reproductive/Obstetrics                             Anesthesia Physical Anesthesia Plan  ASA: II  Anesthesia Plan: General   Post-op Pain Management:    Induction: Intravenous  Airway Management Planned: Natural Airway  Additional Equipment:   Intra-op Plan:   Post-operative Plan:   Informed Consent: I have reviewed the patients History and Physical, chart, labs and discussed the procedure including the risks, benefits and alternatives for the proposed anesthesia with the patient or authorized representative who has indicated his/her understanding and acceptance.   Dental advisory given  Plan Discussed with: CRNA and Anesthesiologist  Anesthesia Plan Comments:         Anesthesia Quick  Evaluation

## 2015-12-09 ENCOUNTER — Encounter: Payer: Self-pay | Admitting: Gastroenterology

## 2015-12-09 LAB — SURGICAL PATHOLOGY

## 2016-01-28 ENCOUNTER — Other Ambulatory Visit: Payer: Self-pay | Admitting: Pulmonary Disease

## 2016-03-03 DIAGNOSIS — M9902 Segmental and somatic dysfunction of thoracic region: Secondary | ICD-10-CM | POA: Diagnosis not present

## 2016-03-03 DIAGNOSIS — M9901 Segmental and somatic dysfunction of cervical region: Secondary | ICD-10-CM | POA: Diagnosis not present

## 2016-03-03 DIAGNOSIS — M62830 Muscle spasm of back: Secondary | ICD-10-CM | POA: Diagnosis not present

## 2016-03-29 DIAGNOSIS — M62830 Muscle spasm of back: Secondary | ICD-10-CM | POA: Diagnosis not present

## 2016-03-29 DIAGNOSIS — M9902 Segmental and somatic dysfunction of thoracic region: Secondary | ICD-10-CM | POA: Diagnosis not present

## 2016-03-29 DIAGNOSIS — M9901 Segmental and somatic dysfunction of cervical region: Secondary | ICD-10-CM | POA: Diagnosis not present

## 2016-04-15 DIAGNOSIS — M62830 Muscle spasm of back: Secondary | ICD-10-CM | POA: Diagnosis not present

## 2016-04-15 DIAGNOSIS — M9902 Segmental and somatic dysfunction of thoracic region: Secondary | ICD-10-CM | POA: Diagnosis not present

## 2016-04-15 DIAGNOSIS — M9901 Segmental and somatic dysfunction of cervical region: Secondary | ICD-10-CM | POA: Diagnosis not present

## 2016-04-17 DIAGNOSIS — M9902 Segmental and somatic dysfunction of thoracic region: Secondary | ICD-10-CM | POA: Diagnosis not present

## 2016-04-17 DIAGNOSIS — M9901 Segmental and somatic dysfunction of cervical region: Secondary | ICD-10-CM | POA: Diagnosis not present

## 2016-04-17 DIAGNOSIS — M62830 Muscle spasm of back: Secondary | ICD-10-CM | POA: Diagnosis not present

## 2016-04-20 DIAGNOSIS — M62830 Muscle spasm of back: Secondary | ICD-10-CM | POA: Diagnosis not present

## 2016-04-20 DIAGNOSIS — M9902 Segmental and somatic dysfunction of thoracic region: Secondary | ICD-10-CM | POA: Diagnosis not present

## 2016-04-20 DIAGNOSIS — M9901 Segmental and somatic dysfunction of cervical region: Secondary | ICD-10-CM | POA: Diagnosis not present

## 2016-04-27 DIAGNOSIS — D508 Other iron deficiency anemias: Secondary | ICD-10-CM | POA: Diagnosis not present

## 2016-04-27 DIAGNOSIS — I10 Essential (primary) hypertension: Secondary | ICD-10-CM | POA: Diagnosis not present

## 2016-05-04 DIAGNOSIS — M150 Primary generalized (osteo)arthritis: Secondary | ICD-10-CM | POA: Diagnosis not present

## 2016-05-04 DIAGNOSIS — E782 Mixed hyperlipidemia: Secondary | ICD-10-CM | POA: Diagnosis not present

## 2016-05-04 DIAGNOSIS — Z23 Encounter for immunization: Secondary | ICD-10-CM | POA: Diagnosis not present

## 2016-05-04 DIAGNOSIS — I10 Essential (primary) hypertension: Secondary | ICD-10-CM | POA: Diagnosis not present

## 2016-07-11 DIAGNOSIS — W57XXXA Bitten or stung by nonvenomous insect and other nonvenomous arthropods, initial encounter: Secondary | ICD-10-CM | POA: Diagnosis not present

## 2016-07-11 DIAGNOSIS — R5383 Other fatigue: Secondary | ICD-10-CM | POA: Diagnosis not present

## 2016-07-11 DIAGNOSIS — J069 Acute upper respiratory infection, unspecified: Secondary | ICD-10-CM | POA: Diagnosis not present

## 2016-07-11 DIAGNOSIS — S70261A Insect bite (nonvenomous), right hip, initial encounter: Secondary | ICD-10-CM | POA: Diagnosis not present

## 2016-11-01 DIAGNOSIS — Z01419 Encounter for gynecological examination (general) (routine) without abnormal findings: Secondary | ICD-10-CM | POA: Diagnosis not present

## 2016-11-01 DIAGNOSIS — Z124 Encounter for screening for malignant neoplasm of cervix: Secondary | ICD-10-CM | POA: Diagnosis not present

## 2016-11-09 DIAGNOSIS — M150 Primary generalized (osteo)arthritis: Secondary | ICD-10-CM | POA: Diagnosis not present

## 2016-11-09 DIAGNOSIS — I10 Essential (primary) hypertension: Secondary | ICD-10-CM | POA: Diagnosis not present

## 2016-11-09 DIAGNOSIS — D508 Other iron deficiency anemias: Secondary | ICD-10-CM | POA: Diagnosis not present

## 2016-11-09 DIAGNOSIS — E782 Mixed hyperlipidemia: Secondary | ICD-10-CM | POA: Diagnosis not present

## 2016-11-09 DIAGNOSIS — J4520 Mild intermittent asthma, uncomplicated: Secondary | ICD-10-CM | POA: Diagnosis not present

## 2016-11-12 DIAGNOSIS — Z1231 Encounter for screening mammogram for malignant neoplasm of breast: Secondary | ICD-10-CM | POA: Diagnosis not present

## 2016-11-16 DIAGNOSIS — J4520 Mild intermittent asthma, uncomplicated: Secondary | ICD-10-CM | POA: Diagnosis not present

## 2016-11-16 DIAGNOSIS — Z0000 Encounter for general adult medical examination without abnormal findings: Secondary | ICD-10-CM | POA: Diagnosis not present

## 2016-11-16 DIAGNOSIS — I10 Essential (primary) hypertension: Secondary | ICD-10-CM | POA: Diagnosis not present

## 2016-11-25 DIAGNOSIS — M8588 Other specified disorders of bone density and structure, other site: Secondary | ICD-10-CM | POA: Diagnosis not present

## 2016-12-09 DIAGNOSIS — Z23 Encounter for immunization: Secondary | ICD-10-CM | POA: Diagnosis not present

## 2017-05-31 DIAGNOSIS — I10 Essential (primary) hypertension: Secondary | ICD-10-CM | POA: Diagnosis not present

## 2017-05-31 DIAGNOSIS — E782 Mixed hyperlipidemia: Secondary | ICD-10-CM | POA: Diagnosis not present

## 2017-06-02 DIAGNOSIS — W57XXXA Bitten or stung by nonvenomous insect and other nonvenomous arthropods, initial encounter: Secondary | ICD-10-CM | POA: Diagnosis not present

## 2017-06-02 DIAGNOSIS — S40861A Insect bite (nonvenomous) of right upper arm, initial encounter: Secondary | ICD-10-CM | POA: Diagnosis not present

## 2017-06-02 DIAGNOSIS — R5383 Other fatigue: Secondary | ICD-10-CM | POA: Diagnosis not present

## 2017-06-07 DIAGNOSIS — J4520 Mild intermittent asthma, uncomplicated: Secondary | ICD-10-CM | POA: Diagnosis not present

## 2017-06-07 DIAGNOSIS — W57XXXS Bitten or stung by nonvenomous insect and other nonvenomous arthropods, sequela: Secondary | ICD-10-CM | POA: Diagnosis not present

## 2017-06-07 DIAGNOSIS — D751 Secondary polycythemia: Secondary | ICD-10-CM | POA: Diagnosis not present

## 2017-06-07 DIAGNOSIS — I10 Essential (primary) hypertension: Secondary | ICD-10-CM | POA: Diagnosis not present

## 2017-06-07 DIAGNOSIS — R21 Rash and other nonspecific skin eruption: Secondary | ICD-10-CM | POA: Diagnosis not present

## 2017-06-13 DIAGNOSIS — R5383 Other fatigue: Secondary | ICD-10-CM | POA: Diagnosis not present

## 2017-06-13 DIAGNOSIS — R12 Heartburn: Secondary | ICD-10-CM | POA: Diagnosis not present

## 2017-06-13 DIAGNOSIS — M2550 Pain in unspecified joint: Secondary | ICD-10-CM | POA: Diagnosis not present

## 2017-06-21 DIAGNOSIS — J45909 Unspecified asthma, uncomplicated: Secondary | ICD-10-CM | POA: Diagnosis not present

## 2017-06-24 DIAGNOSIS — R05 Cough: Secondary | ICD-10-CM | POA: Diagnosis not present

## 2017-06-28 ENCOUNTER — Ambulatory Visit
Admission: RE | Admit: 2017-06-28 | Discharge: 2017-06-28 | Disposition: A | Discharge status: Home / Self Care | Payer: 59 | Source: Ambulatory Visit | Attending: Internal Medicine | Admitting: Internal Medicine

## 2017-06-28 ENCOUNTER — Other Ambulatory Visit: Payer: Self-pay | Admitting: Internal Medicine

## 2017-06-28 DIAGNOSIS — J398 Other specified diseases of upper respiratory tract: Secondary | ICD-10-CM | POA: Insufficient documentation

## 2017-06-28 DIAGNOSIS — J849 Interstitial pulmonary disease, unspecified: Secondary | ICD-10-CM | POA: Insufficient documentation

## 2017-06-28 DIAGNOSIS — I700 Atherosclerosis of aorta: Secondary | ICD-10-CM | POA: Diagnosis not present

## 2017-06-29 ENCOUNTER — Ambulatory Visit: Payer: 59 | Admitting: Internal Medicine

## 2017-06-29 ENCOUNTER — Encounter: Payer: Self-pay | Admitting: Internal Medicine

## 2017-06-29 VITALS — BP 128/84 | HR 98 | Ht 62.0 in | Wt 136.0 lb

## 2017-06-29 DIAGNOSIS — J4551 Severe persistent asthma with (acute) exacerbation: Secondary | ICD-10-CM

## 2017-06-29 NOTE — Patient Instructions (Addendum)
Take qvar 2 puffs twice daily until you are back to normal, then go back to one puff twice daily.  Use the albuterol as needed.  Continue nasal spray.  Continue zyrtec.  Call us back if not better over next few weeks.   Will do lung function test once you are feeling back to usual.

## 2017-06-29 NOTE — Progress Notes (Signed)
[facility] Consultation      Assessment and Plan:  Asthma with acute exacerbation. - Prolonged exacerbation with increased symptoms of dyspnea and chest tightness.  Barbara Acosta appears to be improving over the last 1 to 2 weeks, therefore we will hold off on any systemic treatments. - Barbara Acosta asked to increase her Qvar to 2 puffs twice daily, asked to continue to use albuterol as needed. - Continue Zyrtec.  Allergic rhinitis. - May be contributing to asthma, continue nasal steroid and nasal antihistamine.  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  There is also significant air trapping consistent with asthma exacerbation at that time. -Barbara Acosta is very physically active normally, Barbara Acosta does not have dyspnea on exertion, therefore I  think that this is an incidental finding and does not require any treatment at this time.  We will check a full pulmonary function test once the Barbara Acosta's asthma symptoms are back to baseline, to look for evidence of air-trapping or reduction in expiratory flow rate.  Orders Placed This Encounter  Procedures  . Pulmonary Function Test [facility] Only   Return in about 3 months (around 09/29/2017).    Date: 06/29/2017  MRN# [id number] Barbara Acosta [birth year]  Referring Physician:   ARACELLI Acosta is a 57 y.o. old female seen in consultation for chief complaint of:    Chief Complaint  Barbara Acosta presents with  . Consult    Asthma: exacerbation more frequently: prod cough w/dark yellow to thick and white: SOB: wheezing: chest tightness    HPI:  Barbara Acosta is a 57 year old female with a history of asthma.  Barbara Acosta was last seen in the pulmonary clinic by Dr. Curt Jews in January 2016.  That time it was noted that Barbara Acosta had sinus drainage symptoms consistent with allergic rhinitis. Since her last visit, her breathing had been doing well. Barbara Acosta was taking qvar 80 1 puff bid, and  rinses mouth. The ventolin has not been needed until the past month, when it was needed almost daily. Barbara Acosta had a tick bite about month ago, and has been getting abx. Barbara Acosta has been a lot of sputum in her lungs, and Barbara Acosta has a cough. Barbara Acosta feels that things have started to improve, particularly since being put on augmentin.  Before this recent flareup the Barbara Acosta was very active exercising for 5 days a week, which includes aerobics and cycling.  Barbara Acosta will had no dyspnea on exertion when doing her usual exercise activities. Barbara Acosta is currently using the ventolin about 4 times per day and feels like it is helping.  Barbara Acosta does have nasal drainage, takes nasacort and astelin, zyrtec. Not on singulair.  Barbara Acosta denies reflux.  Asthma does not wake her from sleep.  Barbara Acosta is sleepy during the day. Barbara Acosta has never been tested for OSA. Barbara Acosta goes to bed at 9pm, wakes at 5am, when wakes in am does not feel very rested recently.  Barbara Acosta has a dog, not in bed.   Images personally reviewed; CT high-resolution chest 06/28/2017; lungs are normal.  And expiratory views there is prominent collapse of the trachea and mainstem bronchi with air trapping.   PMHX:   Past Medical History:  Diagnosis Date  . Allergic rhinitis   . Asthma   . Hyperlipidemia   . Hypertension   . Iron deficiency anemia   . Osteoarthritis    Surgical Hx:  Past Surgical History:  Procedure Laterality Date  . COLONOSCOPY    . COLONOSCOPY WITH  PROPOFOL N/A 12/08/2015   Procedure: COLONOSCOPY WITH PROPOFOL;  Surgeon: Lollie Sails, MD;  Location: [facility] ENDOSCOPY;  Service: Endoscopy;  Laterality: N/A;  . ESOPHAGOGASTRODUODENOSCOPY    . KNEE SURGERY Left    arthoscopy    Family Hx:  Family History  Problem Relation Age of Onset  . Cancer - Colon Mother   . Uterine cancer Mother   . Lung cancer Brother    Social Hx:   Social History   Tobacco Use  . Smoking status: Never Smoker  . Smokeless tobacco: Never Used  Substance Use Topics  . Alcohol  use: No  . Drug use: No   Medication:    Current Outpatient Medications:  .  albuterol (VENTOLIN HFA) 108 (90 BASE) MCG/ACT inhaler, Inhale 2 puffs into the lungs every 6 (six) hours as needed for wheezing or shortness of breath., Disp: 1 Inhaler, Rfl: 4 .  ALPRAZolam (XANAX) 1 MG tablet, Take 1 mg by mouth as needed., Disp: , Rfl: 0 .  amLODipine (NORVASC) 5 MG tablet, Take 1 tablet by mouth daily., Disp: , Rfl:  .  amoxicillin (AMOXIL) 875 MG tablet, Take 875 mg by mouth 2 (two) times daily., Disp: , Rfl:  .  Ascorbic Acid (VITAMIN C) 1000 MG tablet, Take 1,000 mg by mouth daily., Disp: , Rfl:  .  azelastine (ASTELIN) 0.1 % nasal spray, Place 2 sprays into both nostrils 2 (two) times daily. Use in each nostril as directed, Disp: 90 mL, Rfl: 3 .  b complex vitamins tablet, Take 1 tablet by mouth daily., Disp: , Rfl:  .  beclomethasone (QVAR) 80 MCG/ACT inhaler, inhale 1 puff by mouth twice a day, Disp: 8.7 Inhaler, Rfl: 0 .  cetirizine (ZYRTEC) 10 MG tablet, Take 10 mg by mouth daily., Disp: , Rfl:  .  fish oil-omega-3 fatty acids 1000 MG capsule, Take 1 g by mouth daily., Disp: , Rfl:  .  meloxicam (MOBIC) 15 MG tablet, Take 1 tablet (15 mg total) by mouth daily., Disp: 30 tablet, Rfl: 3 .  Multiple Vitamin (MULTIVITAMIN) tablet, Take 1 tablet by mouth daily., Disp: , Rfl:  .  Probiotic Product (PROBIOTIC DAILY PO), Take 1 capsule by mouth daily., Disp: , Rfl:  .  triamcinolone (NASACORT) 55 MCG/ACT AERO nasal inhaler, Place 2 sprays into the nose daily., Disp: , Rfl:    Allergies:  Levofloxacin and Tessalon [benzonatate]  Review of Systems: Gen:  Denies  fever, sweats, chills HEENT: Denies blurred vision, double vision. bleeds, sore throat Cvc:  No dizziness, chest pain. Resp:   Denies cough or sputum production, shortness of breath Gi: Denies swallowing difficulty, stomach pain. Gu:  Denies bladder incontinence, burning urine Ext:   No Joint pain, stiffness. Skin: No skin rash,   hives  Endoc:  No polyuria, polydipsia. Psych: No depression, insomnia. Other:  All other systems were reviewed with the Barbara Acosta and were negative other that what is mentioned in the HPI.   Physical Examination:   VS: BP 128/84 (BP Location: Left Arm, Cuff Size: Normal)   Pulse 98   Ht 5\' 2"  (1.575 m)   Wt 136 lb (61.7 kg)   SpO2 97%   BMI 24.87 kg/m   General Appearance: No distress  Neuro:without focal findings,  speech normal,  HEENT: PERRLA, EOM intact.   Pulmonary: normal breath sounds, No wheezing.  CardiovascularNormal S1,S2.  No m/r/g.   Abdomen: Benign, Soft, non-tender. Renal:  No costovertebral tenderness  GU:  No performed at this time.  Endoc: No evident thyromegaly, no signs of acromegaly. Skin:   warm, no rashes, no ecchymosis  Extremities: normal, no cyanosis, clubbing.  Other findings:    LABORATORY PANEL:   CBC No results for input(s): WBC, HGB, HCT, PLT in the last 168 hours. ------------------------------------------------------------------------------------------------------------------  Chemistries  No results for input(s): NA, K, CL, CO2, GLUCOSE, BUN, CREATININE, CALCIUM, MG, AST, ALT, ALKPHOS, BILITOT in the last 168 hours.  Invalid input(s): GFRCGP ------------------------------------------------------------------------------------------------------------------  Cardiac Enzymes No results for input(s): TROPONINI in the last 168 hours. ------------------------------------------------------------  RADIOLOGY:  Ct Chest High Resolution  Result Date: 06/28/2017 CLINICAL DATA:  57 year old female with history of cough, shortness of breath and chest pain after tick bites sustained in March 2019. Evaluate for interstitial lung disease. EXAM: CT CHEST WITHOUT CONTRAST TECHNIQUE: Multidetector CT imaging of the chest was performed following the standard protocol without intravenous contrast. High resolution imaging of the lungs, as well as inspiratory  and expiratory imaging, was performed. COMPARISON:  No priors. FINDINGS: Cardiovascular: Heart size is normal. There is no significant pericardial fluid, thickening or pericardial calcification. Aortic atherosclerosis. Mediastinum/Nodes: No pathologically enlarged mediastinal or hilar lymph nodes. Please note that accurate exclusion of hilar adenopathy is limited on noncontrast CT scans. Esophagus is unremarkable in appearance. No axillary lymphadenopathy. Lungs/Pleura: There are some bilateral apical nodular areas of pleuroparenchymal thickening and architectural distortion, most compatible with chronic post infectious or inflammatory changes. No other suspicious appearing pulmonary nodules or masses are noted. High-resolution images otherwise demonstrate no significant regions of ground-glass attenuation, subpleural reticulation, parenchymal banding, traction bronchiectasis or frank honeycombing. Inspiratory and expiratory imaging demonstrates mild to moderate air trapping indicative of small airways disease. There is also considerable compression of the trachea and mainstem bronchi during expiration indicative of tracheobronchomalacia. No acute consolidative airspace disease. No pleural effusions. Upper Abdomen: Aortic atherosclerosis. 2 mm nonobstructive calculus in the interpolar collecting system of the left kidney. Musculoskeletal: There are no aggressive appearing lytic or blastic lesions noted in the visualized portions of the skeleton. IMPRESSION: 1. No findings to suggest interstitial lung disease. 2. Severe tracheobronchomalacia. 3. Mild to moderate air trapping indicative of small airways disease. 4. Aortic atherosclerosis. Aortic Atherosclerosis (ICD10-I70.0). Electronically Signed   By: Vinnie Langton M.D.   On: 06/28/2017 14:51       Thank  you for the consultation and for allowing [facility] to assist in the care of your Barbara Acosta. Our recommendations are noted  above.  Please contact us if we can be of further service.   Marda Stalker, MD.  Board Certified in Internal Medicine, Pulmonary Medicine, [place], and Sleep Medicine.  [facility] Office Number: [phone]  Patricia Pesa, M.D.  Merton Border, M.D  06/29/2017

## 2017-09-13 DIAGNOSIS — Z13220 Encounter for screening for lipoid disorders: Secondary | ICD-10-CM | POA: Diagnosis not present

## 2017-09-13 DIAGNOSIS — Z131 Encounter for screening for diabetes mellitus: Secondary | ICD-10-CM | POA: Diagnosis not present

## 2017-09-13 DIAGNOSIS — Z1321 Encounter for screening for nutritional disorder: Secondary | ICD-10-CM | POA: Diagnosis not present

## 2017-09-13 DIAGNOSIS — Z136 Encounter for screening for cardiovascular disorders: Secondary | ICD-10-CM | POA: Diagnosis not present

## 2017-09-15 ENCOUNTER — Ambulatory Visit: Payer: 59

## 2017-10-19 DIAGNOSIS — R0602 Shortness of breath: Secondary | ICD-10-CM | POA: Diagnosis not present

## 2017-10-19 DIAGNOSIS — J849 Interstitial pulmonary disease, unspecified: Secondary | ICD-10-CM | POA: Diagnosis not present

## 2017-10-19 DIAGNOSIS — J398 Other specified diseases of upper respiratory tract: Secondary | ICD-10-CM | POA: Diagnosis not present

## 2017-10-19 DIAGNOSIS — J4520 Mild intermittent asthma, uncomplicated: Secondary | ICD-10-CM | POA: Diagnosis not present

## 2017-11-02 DIAGNOSIS — Z01419 Encounter for gynecological examination (general) (routine) without abnormal findings: Secondary | ICD-10-CM | POA: Diagnosis not present

## 2017-11-02 DIAGNOSIS — Z124 Encounter for screening for malignant neoplasm of cervix: Secondary | ICD-10-CM | POA: Diagnosis not present

## 2017-11-14 DIAGNOSIS — I10 Essential (primary) hypertension: Secondary | ICD-10-CM | POA: Diagnosis not present

## 2017-11-14 DIAGNOSIS — D508 Other iron deficiency anemias: Secondary | ICD-10-CM | POA: Diagnosis not present

## 2017-11-14 DIAGNOSIS — E782 Mixed hyperlipidemia: Secondary | ICD-10-CM | POA: Diagnosis not present

## 2017-11-15 DIAGNOSIS — Z1231 Encounter for screening mammogram for malignant neoplasm of breast: Secondary | ICD-10-CM | POA: Diagnosis not present

## 2017-12-13 DIAGNOSIS — R7989 Other specified abnormal findings of blood chemistry: Secondary | ICD-10-CM | POA: Diagnosis not present

## 2017-12-13 DIAGNOSIS — R05 Cough: Secondary | ICD-10-CM | POA: Diagnosis not present

## 2017-12-13 DIAGNOSIS — D751 Secondary polycythemia: Secondary | ICD-10-CM | POA: Diagnosis not present

## 2017-12-20 DIAGNOSIS — R946 Abnormal results of thyroid function studies: Secondary | ICD-10-CM | POA: Diagnosis not present

## 2017-12-30 DIAGNOSIS — J4520 Mild intermittent asthma, uncomplicated: Secondary | ICD-10-CM | POA: Diagnosis not present

## 2018-01-02 DIAGNOSIS — J4520 Mild intermittent asthma, uncomplicated: Secondary | ICD-10-CM | POA: Diagnosis not present

## 2018-01-04 DIAGNOSIS — D751 Secondary polycythemia: Secondary | ICD-10-CM | POA: Diagnosis not present

## 2018-01-04 DIAGNOSIS — J849 Interstitial pulmonary disease, unspecified: Secondary | ICD-10-CM | POA: Diagnosis not present

## 2018-01-04 DIAGNOSIS — J4520 Mild intermittent asthma, uncomplicated: Secondary | ICD-10-CM | POA: Diagnosis not present

## 2018-01-17 DIAGNOSIS — D751 Secondary polycythemia: Secondary | ICD-10-CM | POA: Diagnosis not present

## 2018-01-17 DIAGNOSIS — J4520 Mild intermittent asthma, uncomplicated: Secondary | ICD-10-CM | POA: Diagnosis not present

## 2018-01-18 DIAGNOSIS — J4520 Mild intermittent asthma, uncomplicated: Secondary | ICD-10-CM | POA: Diagnosis not present

## 2018-01-18 DIAGNOSIS — J849 Interstitial pulmonary disease, unspecified: Secondary | ICD-10-CM | POA: Diagnosis not present

## 2018-01-18 DIAGNOSIS — I10 Essential (primary) hypertension: Secondary | ICD-10-CM | POA: Diagnosis not present

## 2018-01-18 DIAGNOSIS — Z23 Encounter for immunization: Secondary | ICD-10-CM | POA: Diagnosis not present

## 2018-02-03 DIAGNOSIS — J849 Interstitial pulmonary disease, unspecified: Secondary | ICD-10-CM | POA: Diagnosis not present

## 2018-02-03 DIAGNOSIS — D751 Secondary polycythemia: Secondary | ICD-10-CM | POA: Diagnosis not present

## 2018-02-03 DIAGNOSIS — J4520 Mild intermittent asthma, uncomplicated: Secondary | ICD-10-CM | POA: Diagnosis not present

## 2018-03-06 DIAGNOSIS — J849 Interstitial pulmonary disease, unspecified: Secondary | ICD-10-CM | POA: Diagnosis not present

## 2018-03-06 DIAGNOSIS — J4520 Mild intermittent asthma, uncomplicated: Secondary | ICD-10-CM | POA: Diagnosis not present

## 2018-03-06 DIAGNOSIS — D751 Secondary polycythemia: Secondary | ICD-10-CM | POA: Diagnosis not present

## 2018-04-06 DIAGNOSIS — J4520 Mild intermittent asthma, uncomplicated: Secondary | ICD-10-CM | POA: Diagnosis not present

## 2018-04-06 DIAGNOSIS — D751 Secondary polycythemia: Secondary | ICD-10-CM | POA: Diagnosis not present

## 2018-04-06 DIAGNOSIS — J849 Interstitial pulmonary disease, unspecified: Secondary | ICD-10-CM | POA: Diagnosis not present

## 2018-05-05 DIAGNOSIS — J4520 Mild intermittent asthma, uncomplicated: Secondary | ICD-10-CM | POA: Diagnosis not present

## 2018-05-05 DIAGNOSIS — J849 Interstitial pulmonary disease, unspecified: Secondary | ICD-10-CM | POA: Diagnosis not present

## 2018-05-05 DIAGNOSIS — D751 Secondary polycythemia: Secondary | ICD-10-CM | POA: Diagnosis not present

## 2018-06-05 DIAGNOSIS — D751 Secondary polycythemia: Secondary | ICD-10-CM | POA: Diagnosis not present

## 2018-06-05 DIAGNOSIS — J4520 Mild intermittent asthma, uncomplicated: Secondary | ICD-10-CM | POA: Diagnosis not present

## 2018-06-05 DIAGNOSIS — J849 Interstitial pulmonary disease, unspecified: Secondary | ICD-10-CM | POA: Diagnosis not present

## 2018-07-05 DIAGNOSIS — D751 Secondary polycythemia: Secondary | ICD-10-CM | POA: Diagnosis not present

## 2018-07-05 DIAGNOSIS — J4520 Mild intermittent asthma, uncomplicated: Secondary | ICD-10-CM | POA: Diagnosis not present

## 2018-07-05 DIAGNOSIS — J849 Interstitial pulmonary disease, unspecified: Secondary | ICD-10-CM | POA: Diagnosis not present

## 2019-09-08 IMAGING — CT CT CHEST HIGH RESOLUTION W/O CM
2 of 6 series · 15 of 36 positions shown, 18 images · non-contrast
Comparison: No priors.

CLINICAL DATA: 56-year-old female with history of cough, shortness
of breath and chest pain after tick bites sustained in April 2017.
Evaluate for interstitial lung disease.

EXAM:
CT CHEST WITHOUT CONTRAST
TECHNIQUE: Multidetector CT imaging of the chest was performed following the
standard protocol without intravenous contrast. High resolution
imaging of the lungs, as well as inspiratory and expiratory imaging,
was performed.

[Series 2: thorax · axial · 0.57mm/px · z∈[-660,-386]mm · 12 of 155 slices shown, 15 images]
[im 9/155  mediastinal]
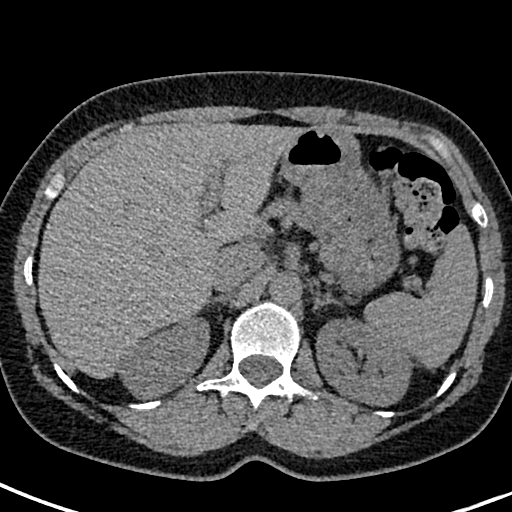
[im 9/155  lung]
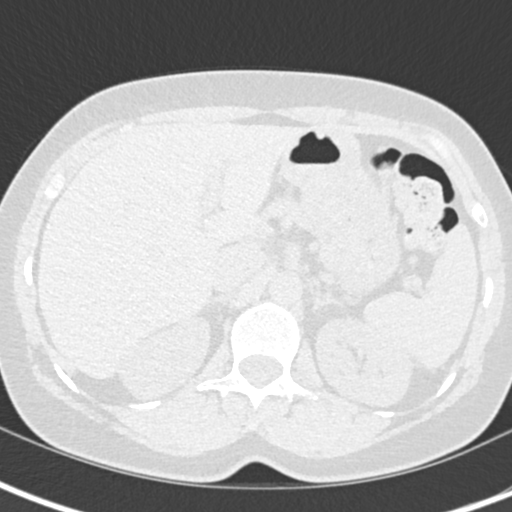
[im 25/155  lung]
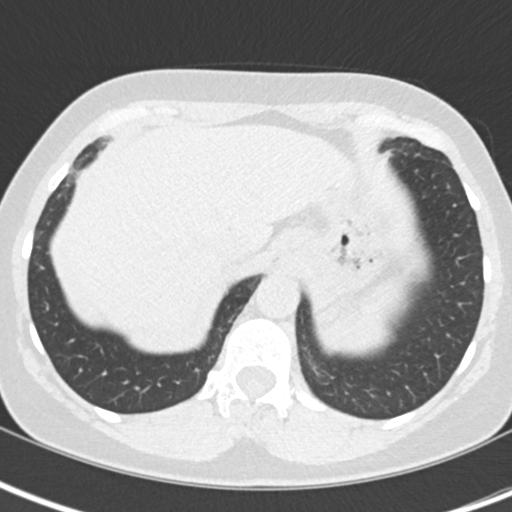
[im 33/155  lung]
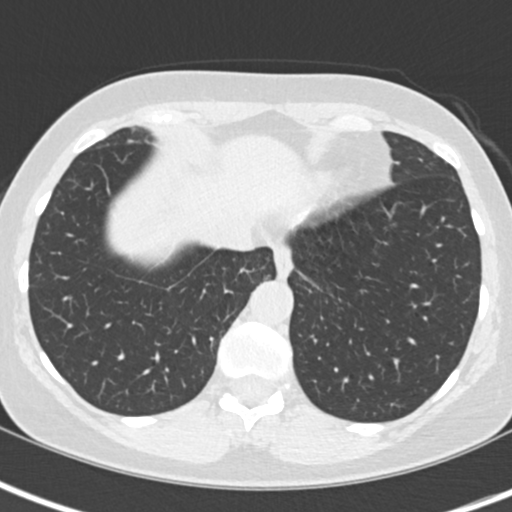
[im 49/155  lung]
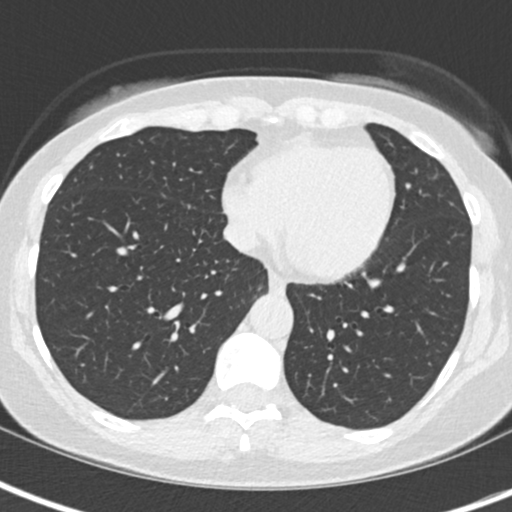
[im 57/155  mediastinal]
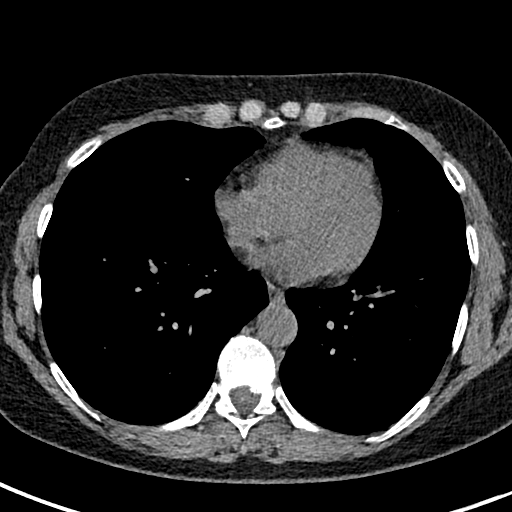
[im 57/155  lung]
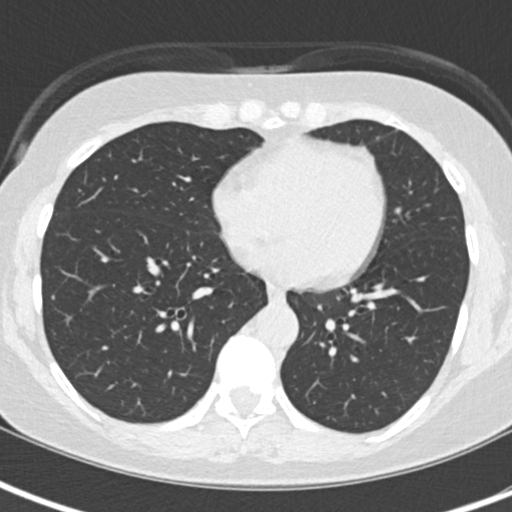
[im 73/155  lung]
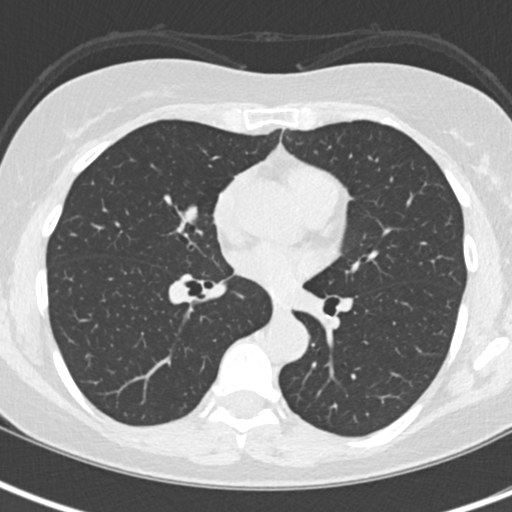
[im 82/155  lung]
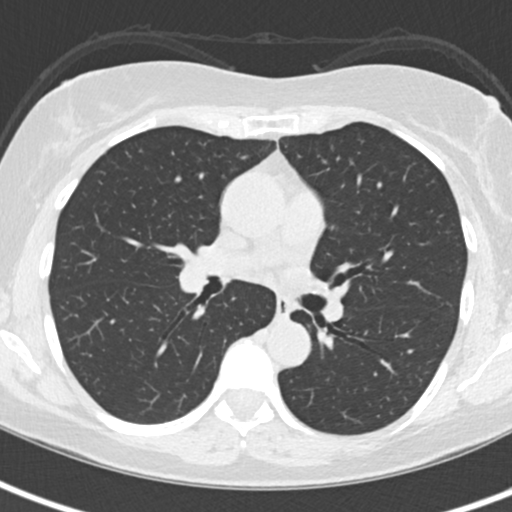
[im 98/155  lung]
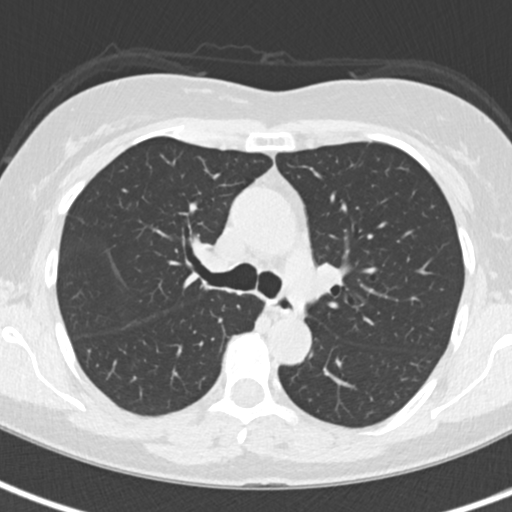
[im 106/155  mediastinal]
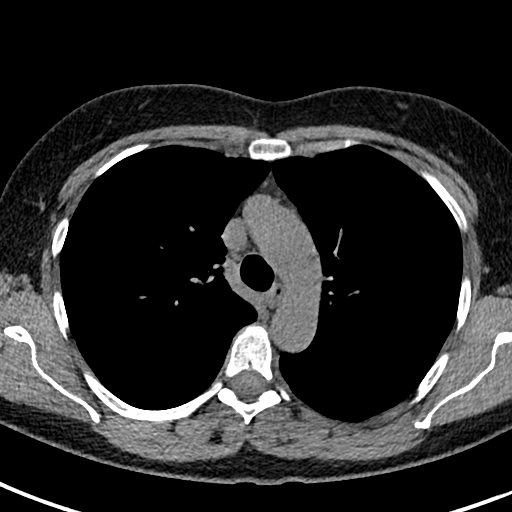
[im 106/155  lung]
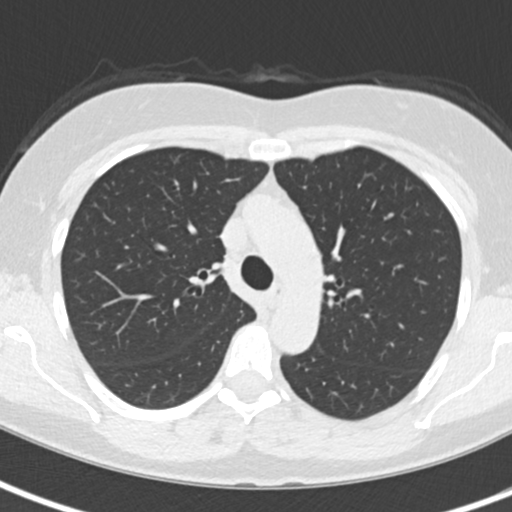
[im 122/155  lung]
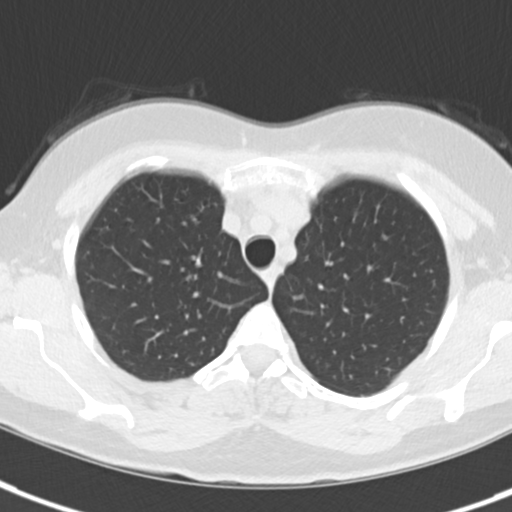
[im 130/155  lung]
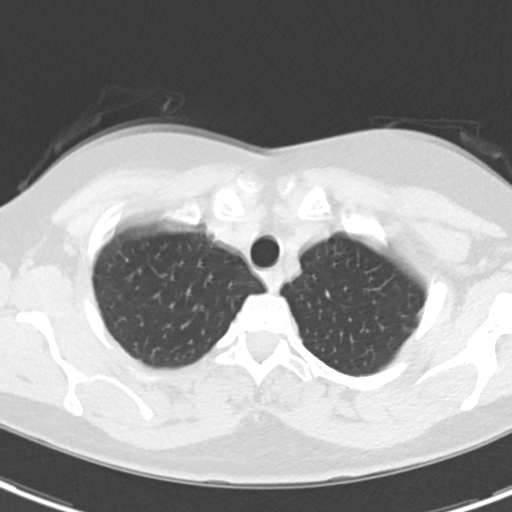
[im 146/155  lung]
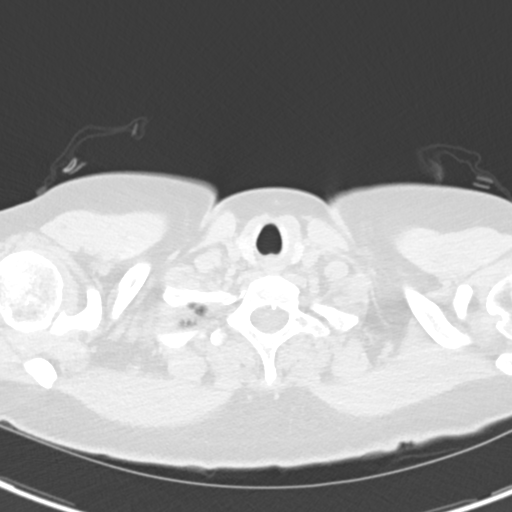

[Series 8: coronal · coronal · 0.61mm/px · 3 of 70 slices shown]
[im 14/70  lung]
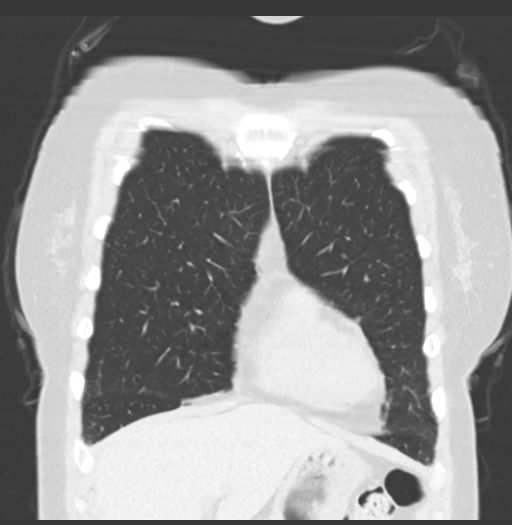
[im 28/70  lung]
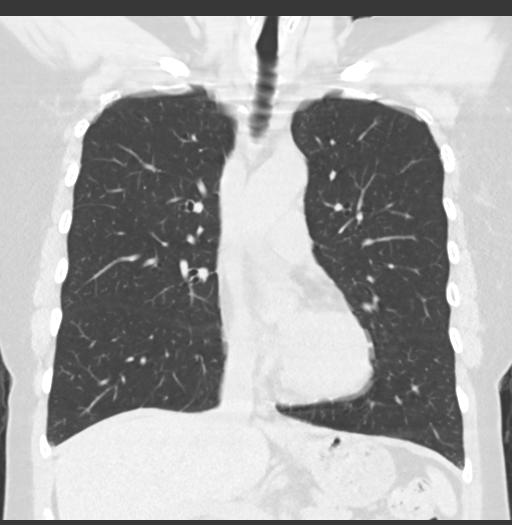
[im 42/70  lung]
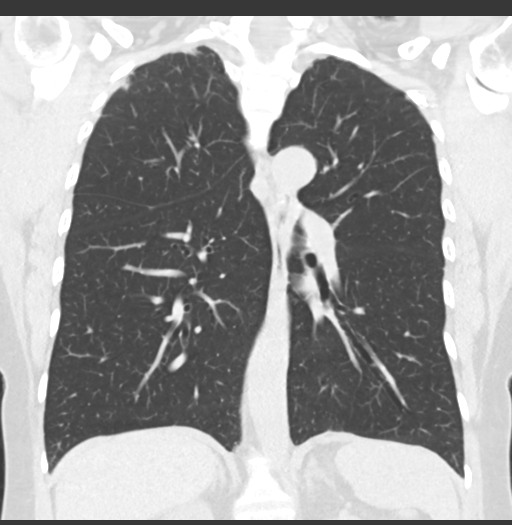

[15 of 36 positions shown; findings below may reference images not displayed]

FINDINGS: Cardiovascular: Heart size is normal. There is no significant
pericardial fluid, thickening or pericardial calcification. Aortic
atherosclerosis.

Mediastinum/Nodes: No pathologically enlarged mediastinal or hilar
lymph nodes. Please note that accurate exclusion of hilar adenopathy
is limited on noncontrast CT scans. Esophagus is unremarkable in
appearance. No axillary lymphadenopathy.

Lungs/Pleura: There are some bilateral apical nodular areas of
pleuroparenchymal thickening and architectural distortion, most
compatible with chronic post infectious or inflammatory changes. No
other suspicious appearing pulmonary nodules or masses are noted.
High-resolution images otherwise demonstrate no significant regions
of ground-glass attenuation, subpleural reticulation, parenchymal
banding, traction bronchiectasis or frank honeycombing. Inspiratory
and expiratory imaging demonstrates mild to moderate air trapping
indicative of small airways disease. There is also considerable
compression of the trachea and mainstem bronchi during expiration
indicative of tracheobronchomalacia. No acute consolidative airspace
disease. No pleural effusions.

Upper Abdomen: Aortic atherosclerosis. 2 mm nonobstructive calculus
in the interpolar collecting system of the left kidney.

Musculoskeletal: There are no aggressive appearing lytic or blastic
lesions noted in the visualized portions of the skeleton.
IMPRESSION: 1. No findings to suggest interstitial lung disease.
2. Severe tracheobronchomalacia.
3. Mild to moderate air trapping indicative of small airways
disease.
4. Aortic atherosclerosis.

Aortic Atherosclerosis (ITFMV-0VH.H).

## 2020-12-31 ENCOUNTER — Encounter: Payer: Self-pay | Admitting: *Deleted

## 2021-01-05 ENCOUNTER — Ambulatory Visit: Payer: 59 | Admitting: Anesthesiology

## 2021-01-05 ENCOUNTER — Encounter
Admission: RE | Disposition: A | Discharge status: Home / Self Care | Payer: Self-pay | Source: Home / Self Care | Attending: Gastroenterology

## 2021-01-05 ENCOUNTER — Ambulatory Visit
Admission: RE | Admit: 2021-01-05 | Discharge: 2021-01-05 | Disposition: A | Discharge status: Home / Self Care | Payer: 59 | Attending: Gastroenterology | Admitting: Gastroenterology

## 2021-01-05 DIAGNOSIS — K640 First degree hemorrhoids: Secondary | ICD-10-CM | POA: Insufficient documentation

## 2021-01-05 DIAGNOSIS — K5730 Diverticulosis of large intestine without perforation or abscess without bleeding: Secondary | ICD-10-CM | POA: Diagnosis not present

## 2021-01-05 DIAGNOSIS — D123 Benign neoplasm of transverse colon: Secondary | ICD-10-CM | POA: Insufficient documentation

## 2021-01-05 DIAGNOSIS — Z800 Family history of malignant neoplasm of digestive organs: Secondary | ICD-10-CM | POA: Insufficient documentation

## 2021-01-05 DIAGNOSIS — E039 Hypothyroidism, unspecified: Secondary | ICD-10-CM | POA: Insufficient documentation

## 2021-01-05 DIAGNOSIS — J45909 Unspecified asthma, uncomplicated: Secondary | ICD-10-CM | POA: Insufficient documentation

## 2021-01-05 DIAGNOSIS — Z8049 Family history of malignant neoplasm of other genital organs: Secondary | ICD-10-CM | POA: Insufficient documentation

## 2021-01-05 DIAGNOSIS — Z1211 Encounter for screening for malignant neoplasm of colon: Secondary | ICD-10-CM | POA: Insufficient documentation

## 2021-01-05 DIAGNOSIS — M1990 Unspecified osteoarthritis, unspecified site: Secondary | ICD-10-CM | POA: Diagnosis not present

## 2021-01-05 DIAGNOSIS — I10 Essential (primary) hypertension: Secondary | ICD-10-CM | POA: Insufficient documentation

## 2021-01-05 DIAGNOSIS — Z8616 Personal history of COVID-19: Secondary | ICD-10-CM | POA: Insufficient documentation

## 2021-01-05 HISTORY — PX: COLONOSCOPY: SHX5424

## 2021-01-05 SURGERY — COLONOSCOPY
Anesthesia: General

## 2021-01-05 MED ORDER — PROPOFOL 10 MG/ML IV BOLUS
INTRAVENOUS | Status: DC | PRN
  Administered 2021-01-05: 70 mg via INTRAVENOUS
  Administered 2021-01-05: 50 mg via INTRAVENOUS
  Administered 2021-01-05: 70 mg via INTRAVENOUS

## 2021-01-05 MED ORDER — LIDOCAINE 2% (20 MG/ML) 5 ML SYRINGE
INTRAMUSCULAR | Status: DC | PRN
  Administered 2021-01-05: 20 mg via INTRAVENOUS

## 2021-01-05 MED ORDER — PROPOFOL 500 MG/50ML IV EMUL
INTRAVENOUS | Status: DC | PRN
  Administered 2021-01-05: 120 ug/kg/min via INTRAVENOUS

## 2021-01-05 MED ORDER — PROPOFOL 500 MG/50ML IV EMUL
INTRAVENOUS | Status: AC
  Filled 2021-01-05: qty 50

## 2021-01-05 MED ORDER — MIDAZOLAM HCL 2 MG/2ML IJ SOLN
INTRAMUSCULAR | Status: AC
  Filled 2021-01-05: qty 2

## 2021-01-05 MED ORDER — MIDAZOLAM HCL 5 MG/5ML IJ SOLN
INTRAMUSCULAR | Status: DC | PRN
  Administered 2021-01-05: 2 mg via INTRAVENOUS

## 2021-01-05 MED ORDER — SODIUM CHLORIDE 0.9 % IV SOLN
INTRAVENOUS | Status: DC
  Administered 2021-01-05: 11:00:00 20 mL/h via INTRAVENOUS

## 2021-01-05 NOTE — Anesthesia Postprocedure Evaluation (Signed)
Anesthesia Post Note  Patient: Barbara Acosta  Procedure(s) Performed: COLONOSCOPY  Patient location during evaluation: PACU Anesthesia Type: General Level of consciousness: awake and alert, oriented and patient cooperative Pain management: pain level controlled Vital Signs Assessment: post-procedure vital signs reviewed and stable Respiratory status: spontaneous breathing, nonlabored ventilation and respiratory function stable Cardiovascular status: blood pressure returned to baseline and stable Postop Assessment: adequate PO intake Anesthetic complications: no   No notable events documented.   Last Vitals:  Vitals:   01/05/21 1139 01/05/21 1149  BP: (!) 125/91 (!) 130/92  Pulse: 92   Resp: 19 20  Temp:    SpO2: 98%     Last Pain:  Vitals:   01/05/21 1149  TempSrc:   PainSc: 0-No pain                 Darrin Nipper

## 2021-01-05 NOTE — Transfer of Care (Signed)
Immediate Anesthesia Transfer of Care Note  Patient: Barbara Acosta  Procedure(s) Performed: COLONOSCOPY  Patient Location: Endoscopy Unit  Anesthesia Type:General  Level of Consciousness: awake  Airway & Oxygen Therapy: Patient Spontanous Breathing  Post-op Assessment: Report given to RN and Post -op Vital signs reviewed and stable  Post vital signs: Reviewed  Last Vitals:  Vitals Value Taken Time  BP 122/72 01/05/21 1129  Temp 36.8 C 01/05/21 1129  Pulse 93 01/05/21 1129  Resp 18 01/05/21 1129  SpO2 98 % 01/05/21 1129  Vitals shown include unvalidated device data.  Last Pain:  Vitals:   01/05/21 1129  TempSrc: Tympanic  PainSc: Asleep         Complications: No notable events documented.

## 2021-01-05 NOTE — Op Note (Signed)
[facility] Patient Name: Barbara Acosta Procedure Date: 01/05/2021 10:41 AM MRN: [id number] Account #: [id number] Date of Birth: May 18, 1960 Admit Type: Outpatient Age: 60 Room: [facility] ENDO ROOM 3 Gender: Female Note Status: Finalized Instrument Name: Jasper Riling [id number] Procedure:             Colonoscopy Indications:           Surveillance: Personal history of adenomatous polyps                         on last colonoscopy 5 years ago Providers:             Andrey Farmer MD, MD Referring MD:          Ramonita Lab, MD (Referring MD) Medicines:             Monitored Anesthesia Care Complications:         No immediate complications. Estimated blood loss:                         Minimal. Procedure:             Pre-Anesthesia Assessment:                        - Prior to the procedure, a History and Physical was                         performed, and patient medications and allergies were                         reviewed. The patient is competent. The risks and                         benefits of the procedure and the sedation options and                         risks were discussed with the patient. All questions                         were answered and informed consent was obtained.                         Patient identification and proposed procedure were                         verified by the physician, the nurse, the anesthetist                         and the technician in the endoscopy suite. Mental                         Status Examination: alert and oriented. Airway                         Examination: normal oropharyngeal airway and neck                         mobility. Respiratory Examination: clear to  auscultation. CV Examination: normal. Prophylactic                         Antibiotics: The patient does not require prophylactic                         antibiotics. Prior Anticoagulants: The patient has                          taken no previous anticoagulant or antiplatelet                         agents. ASA Grade Assessment: II - A patient with mild                         systemic disease. After reviewing the risks and                         benefits, the patient was deemed in satisfactory                         condition to undergo the procedure. The anesthesia                         plan was to use monitored anesthesia care (MAC).                         Immediately prior to administration of medications,                         the patient was re-assessed for adequacy to receive                         sedatives. The heart rate, respiratory rate, oxygen                         saturations, blood pressure, adequacy of pulmonary                         ventilation, and response to care were monitored                         throughout the procedure. The physical status of the                         patient was re-assessed after the procedure.                        After obtaining informed consent, the colonoscope was                         passed under direct vision. Throughout the procedure,                         the patient's blood pressure, pulse, and oxygen                         saturations were monitored continuously. The  Colonoscope was introduced through the anus and                         advanced to the the cecum, identified by appendiceal                         orifice and ileocecal valve. The colonoscopy was                         performed without difficulty. The patient tolerated                         the procedure well. The quality of the bowel                         preparation was good. Findings:      The perianal and digital rectal examinations were normal.      A 1 mm polyp was found in the ascending colon. The polyp was sessile.       The polyp was removed with a jumbo cold forceps. Resection and retrieval       were complete. Estimated  blood loss was minimal.      Two sessile polyps were found in the transverse colon. The polyps were 1       to 2 mm in size. These polyps were removed with a cold snare. The polyps       were next to each other so removed both at one time so may appear as       single polyp under pathology. Resection and retrieval were complete.       Estimated blood loss was minimal.      Scattered small and large-mouthed diverticula were found in the sigmoid       colon, descending colon, transverse colon and ascending colon.      The mucosa vascular pattern in the rectum was locally increased.       Biopsies were taken with a cold forceps for histology. Estimated blood       loss was minimal.      Internal hemorrhoids were found during retroflexion. The hemorrhoids       were Grade I (internal hemorrhoids that do not prolapse).      The exam was otherwise without abnormality on direct and retroflexion       views. Impression:            - One 1 mm polyp in the ascending colon, removed with                         a jumbo cold forceps. Resected and retrieved.                        - Two 1 to 2 mm polyps in the transverse colon,                         removed with a cold snare. Resected and retrieved.                        - Diverticulosis in the sigmoid colon, in the  descending colon, in the transverse colon and in the                         ascending colon.                        - Increased mucosa vascular pattern in the rectum.                         Biopsied.                        - Internal hemorrhoids.                        - The examination was otherwise normal on direct and                         retroflexion views. Recommendation:        - Discharge patient to home.                        - Resume previous diet.                        - Continue present medications.                        - Await pathology results.                        - Repeat colonoscopy  for surveillance based on                         pathology results. No longer than 5 years given family                         history.                        - Return to referring physician as previously                         scheduled. Procedure Code(s):     --- Professional ---                        [phone], Colonoscopy, flexible; with removal of                         tumor(s), polyp(s), or other lesion(s) by snare                         technique                        [id number], 71, Colonoscopy, flexible; with biopsy, single                         or multiple Diagnosis Code(s):     --- Professional ---                        K63.5, Polyp of colon  Z86.010, Personal history of colonic polyps                        K64.0, First degree hemorrhoids                        K57.30, Diverticulosis of large intestine without                         perforation or abscess without bleeding CPT copyright 2019 American Medical Association. All rights reserved. The codes documented in this report are preliminary and upon coder review may  be revised to meet current compliance requirements. Andrey Farmer MD, MD 01/05/2021 11:30:26 AM Number of Addenda: 0 Note Initiated On: 01/05/2021 10:41 AM Scope Withdrawal Time: 0 hours 11 minutes 59 seconds  Total Procedure Duration: 0 hours 17 minutes 38 seconds  Estimated Blood Loss:  Estimated blood loss was minimal.      [facility]

## 2021-01-05 NOTE — Anesthesia Preprocedure Evaluation (Addendum)
Anesthesia Evaluation  Patient identified by MRN, date of birth, ID band Patient awake    Reviewed: Allergy & Precautions, NPO status , Patient's Chart, lab work & pertinent test results  History of Anesthesia Complications Negative for: history of anesthetic complications  Airway Mallampati: II   Neck ROM: Full    Dental  (+)    Pulmonary asthma ,  COVID+ 09/2020   Pulmonary exam normal breath sounds clear to auscultation       Cardiovascular hypertension, Normal cardiovascular exam Rhythm:Regular Rate:Normal  ECG 10/10/20:  Normal sinus rhythm Possible Left atrial enlargement Nonspecific ST abnormality   Neuro/Psych negative neurological ROS     GI/Hepatic negative GI ROS,   Endo/Other  negative endocrine ROS  Renal/GU negative Renal ROS     Musculoskeletal  (+) Arthritis ,   Abdominal   Peds  Hematology  (+) Blood dyscrasia, anemia ,   Anesthesia Other Findings   Reproductive/Obstetrics                            Anesthesia Physical Anesthesia Plan  ASA: 2  Anesthesia Plan: General   Post-op Pain Management:    Induction: Intravenous  PONV Risk Score and Plan: 3 and Propofol infusion, TIVA and Treatment may vary due to age or medical condition  Airway Management Planned: Natural Airway  Additional Equipment:   Intra-op Plan:   Post-operative Plan:   Informed Consent: I have reviewed the patients History and Physical, chart, labs and discussed the procedure including the risks, benefits and alternatives for the proposed anesthesia with the patient or authorized representative who has indicated his/her understanding and acceptance.       Plan Discussed with: CRNA  Anesthesia Plan Comments: (LMA/GETA backup discussed.  Patient consented for risks of anesthesia including but not limited to:  - adverse reactions to medications - damage to eyes, teeth, lips or other  oral mucosa - nerve damage due to positioning  - sore throat or hoarseness - damage to heart, brain, nerves, lungs, other parts of body or loss of life  Informed patient about role of CRNA in peri- and intra-operative care.  Patient voiced understanding.)        Anesthesia Quick Evaluation

## 2021-01-05 NOTE — H&P (Signed)
Outpatient short stay form Pre-procedure 01/05/2021  Barbara Rubenstein, MD  Primary Physician: Adin Hector, MD  Reason for visit:  Surveillance colonoscopy  History of present illness:   60 y/o lady with history of hypertension and hypothyroidism here for surveillance colonoscopy. Last colonoscopy was 5 years ago with one adenomatous polyp. Mom had colon cancer at 57 and uterine cancer at 72. Sister with uterine cancer at 46. PREMM score calculated at 2.5%. No abdominal surgeries. No blood thinners.    Current Facility-Administered Medications:    0.9 %  sodium chloride infusion, , Intravenous, Continuous, Lechelle Wrigley, Hilton Cork, MD, Last Rate: 20 mL/hr at 01/05/21 1044, 20 mL/hr at 01/05/21 1044  Medications Prior to Admission  Medication Sig Dispense Refill Last Dose   albuterol (VENTOLIN HFA) 108 (90 BASE) MCG/ACT inhaler Inhale 2 puffs into the lungs every 6 (six) hours as needed for wheezing or shortness of breath. 1 Inhaler 4 01/04/2021   amLODipine (NORVASC) 5 MG tablet Take 1 tablet by mouth daily.   01/05/2021 at 0545   Ascorbic Acid (VITAMIN C) 1000 MG tablet Take 1,000 mg by mouth daily.   01/04/2021   azelastine (ASTELIN) 0.1 % nasal spray Place 2 sprays into both nostrils 2 (two) times daily. Use in each nostril as directed 90 mL 3 01/04/2021   b complex vitamins tablet Take 1 tablet by mouth daily.   01/04/2021   beclomethasone (QVAR) 80 MCG/ACT inhaler inhale 1 puff by mouth twice a day 8.7 Inhaler 0 01/04/2021   cetirizine (ZYRTEC) 10 MG tablet Take 10 mg by mouth daily.   01/04/2021   fish oil-omega-3 fatty acids 1000 MG capsule Take 1 g by mouth daily.   01/04/2021   levothyroxine (SYNTHROID) 50 MCG tablet Take 50 mcg by mouth daily before breakfast.   01/05/2021 at 0430   meloxicam (MOBIC) 15 MG tablet Take 1 tablet (15 mg total) by mouth daily. 30 tablet 3 01/04/2021   Multiple Vitamin (MULTIVITAMIN) tablet Take 1 tablet by mouth daily.   01/04/2021   Probiotic  Product (PROBIOTIC DAILY PO) Take 1 capsule by mouth daily.   01/04/2021   triamcinolone (NASACORT) 55 MCG/ACT AERO nasal inhaler Place 2 sprays into the nose daily.   01/04/2021   ALPRAZolam (XANAX) 1 MG tablet Take 1 mg by mouth as needed.  0    amoxicillin (AMOXIL) 875 MG tablet Take 875 mg by mouth 2 (two) times daily. (Patient not taking: Reported on 01/05/2021)   Completed Course     Allergies  Allergen Reactions   Levofloxacin    Tessalon [Benzonatate] Other (See Comments)    Intense Headache     Past Medical History:  Diagnosis Date   Allergic rhinitis    Asthma    Hyperlipidemia    Hypertension    Iron deficiency anemia    Osteoarthritis     Review of systems:  Otherwise negative.    Physical Exam  Gen: Alert, oriented. Appears stated age.  HEENT: PERRLA. Lungs: No respiratory distress CV: RRR Abd: soft, benign, no masses Ext: No edema    Planned procedures: Proceed with colonoscopy. The patient understands the nature of the planned procedure, indications, risks, alternatives and potential complications including but not limited to bleeding, infection, perforation, damage to internal organs and possible oversedation/side effects from anesthesia. The patient agrees and gives consent to proceed.  Please refer to procedure notes for findings, recommendations and patient disposition/instructions.     Barbara Rubenstein, MD [facility]

## 2021-01-05 NOTE — Interval H&P Note (Signed)
History and Physical Interval Note:  01/05/2021 10:52 AM  Barbara Acosta  has presented today for surgery, with the diagnosis of Hisotory of adenomatous plopy of colon.  The various methods of treatment have been discussed with the patient and family. After consideration of risks, benefits and other options for treatment, the patient has consented to  Procedure(s): COLONOSCOPY (N/A) as a surgical intervention.  The patient's history has been reviewed, patient examined, no change in status, stable for surgery.  I have reviewed the patient's chart and labs.  Questions were answered to the patient's satisfaction.     Lesly Rubenstein  Ok to proceed with colonoscopy

## 2021-01-06 ENCOUNTER — Encounter: Payer: Self-pay | Admitting: Gastroenterology

## 2021-01-06 LAB — SURGICAL PATHOLOGY
